# Patient Record
Sex: Female | Born: 1990 | Race: White | Hispanic: No | Marital: Married | Smoking: Never smoker
Health system: Southern US, Community
[De-identification: ages and names within clinical notes are randomized; demographics above are authoritative.]

## PROBLEM LIST (undated history)

## (undated) DIAGNOSIS — E039 Hypothyroidism, unspecified: Secondary | ICD-10-CM

## (undated) DIAGNOSIS — G43909 Migraine, unspecified, not intractable, without status migrainosus: Secondary | ICD-10-CM

## (undated) DIAGNOSIS — E079 Disorder of thyroid, unspecified: Secondary | ICD-10-CM

## (undated) DIAGNOSIS — R3129 Other microscopic hematuria: Secondary | ICD-10-CM

## (undated) DIAGNOSIS — Z8639 Personal history of other endocrine, nutritional and metabolic disease: Secondary | ICD-10-CM

## (undated) HISTORY — DX: Personal history of other endocrine, nutritional and metabolic disease: Z86.39

## (undated) HISTORY — DX: Other microscopic hematuria: R31.29

## (undated) HISTORY — DX: Disorder of thyroid, unspecified: E07.9

## (undated) HISTORY — DX: Migraine, unspecified, not intractable, without status migrainosus: G43.909

---
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
## 2009-10-18 HISTORY — PX: WISDOM TOOTH EXTRACTION: SHX21

## 2015-10-21 ENCOUNTER — Ambulatory Visit (INDEPENDENT_AMBULATORY_CARE_PROVIDER_SITE_OTHER): Payer: BLUE CROSS/BLUE SHIELD | Admitting: Family

## 2015-10-21 ENCOUNTER — Encounter: Payer: Self-pay | Admitting: Family

## 2015-10-21 ENCOUNTER — Telehealth: Payer: Self-pay | Admitting: Family

## 2015-10-21 VITALS — BP 128/79 | HR 80 | Temp 98.2°F | Resp 16 | Ht 66.5 in | Wt 129.0 lb

## 2015-10-21 DIAGNOSIS — G43909 Migraine, unspecified, not intractable, without status migrainosus: Secondary | ICD-10-CM | POA: Insufficient documentation

## 2015-10-21 DIAGNOSIS — Z23 Encounter for immunization: Secondary | ICD-10-CM

## 2015-10-21 DIAGNOSIS — E039 Hypothyroidism, unspecified: Secondary | ICD-10-CM | POA: Diagnosis not present

## 2015-10-21 DIAGNOSIS — Z309 Encounter for contraceptive management, unspecified: Secondary | ICD-10-CM | POA: Insufficient documentation

## 2015-10-21 LAB — TSH: TSH: 2.02 u[IU]/mL (ref 0.35–4.50)

## 2015-10-21 MED ORDER — ELETRIPTAN HYDROBROMIDE 40 MG PO TABS: 40.0000 mg | ORAL_TABLET | ORAL | Status: DC | PRN

## 2015-10-21 MED ORDER — LEVOTHYROXINE SODIUM 50 MCG PO TABS: 50.0000 ug | ORAL_TABLET | Freq: Every day | ORAL | Status: DC

## 2015-10-21 MED ORDER — NORGESTIM-ETH ESTRAD TRIPHASIC 0.18/0.215/0.25 MG-25 MCG PO TABS: 1.0000 | ORAL_TABLET | Freq: Every day | ORAL | Status: DC

## 2015-10-21 MED ORDER — CYCLOSPORINE 0.05 % OP EMUL: 1.0000 [drp] | Freq: Two times a day (BID) | OPHTHALMIC | Status: DC

## 2015-10-21 NOTE — Patient Instructions (Signed)
Please complete lab work prior to leaving. Schedule a complete physical at the front desk. Welcome to [facility]! 

## 2015-10-21 NOTE — Assessment & Plan Note (Signed)
Refill provided for ocp, plan pap next visit.

## 2015-10-21 NOTE — Progress Notes (Signed)
Pre visit review using our clinic review tool, if applicable. No additional management support is needed unless otherwise documented below in the visit note. 

## 2015-10-21 NOTE — Assessment & Plan Note (Signed)
Rare, refill provided for prn relpax.

## 2015-10-21 NOTE — Telephone Encounter (Signed)
Opened in error

## 2015-10-21 NOTE — Progress Notes (Signed)
Subjective:     Patient ID: Jenny Eaton, female   DOB: 24-Mar-1991, 25 y.o.   MRN: [id number]  HPI  Jenny Eaton is a 25 yr old female who presents today to establish care. Pmhx is significant for the following:  1) Migraines- uses relpax PRN. Reports that she has only had a handful of migraines in her life (5 in all). Has vision loss in 1 eye and needs to reteat to a dark room.    2) Hx Hashimoto thyroiditis/Hypothyroid- maintained on synthroid. She was diagnosed in HS 2007.  Has not seen a doctor since 2014.  She is maintained on synthroid, 50 mcg once daily.  Energy is fair.    3) Contraceptive management- LMP 12/11,  Last pap was 2014.    4) dry eye syndrome-uses restasis.   She brings with her some outside records which are reviewed.   Review of Systems  Genitourinary: Negative for menstrual problem.  Neurological:       Rare migraines, none recently.         Past Medical History  Diagnosis Date  . Migraines   . Thyroid disease     hypothyroid  . History of Hashimoto thyroiditis 2010    Social History   Social History  . Marital Status: Married    Spouse Name: N/A  . Number of Children: N/A  . Years of Education: N/A   Occupational History  . Not on file.   Social History Main Topics  . Smoking status: Never Smoker   . Smokeless tobacco: Never Used  . Alcohol Use: 0.6 - 1.2 oz/week    1-2 Glasses of wine per week  . Drug Use: No  . Sexual Activity: Not on file   Other Topics Concern  . 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 Wisdom tooth extraction  2011  . Excision of vaginal septum  2008    Family History  Problem Relation Age of Onset  . Hyperlipidemia Mother    . Hashimoto's thyroiditis Maternal Grandmother   . Kidney cancer Maternal Grandmother   . Pulmonary embolism Maternal Grandmother   . Diabetes Maternal Grandfather   . Heart disease Maternal Grandfather     CAD- living, ?hx of cabg  . Hypertension Maternal Grandfather   . Hypertension Paternal Grandmother   . Stroke Paternal Grandmother     Allergies  Allergen Reactions  . Cephalosporins Hives  . Penicillins Hives    No current outpatient prescriptions on file prior to visit.   No current facility-administered medications on file prior to visit.    BP 128/79 mmHg  Pulse 80  Temp(Src) 98.2 F (36.8 C) (Oral)  Resp 16  Ht 5' 6.5" (1.689 m)  Wt 129 lb (58.514 kg)  BMI 20.51 kg/m2  SpO2 100%  LMP 09/28/2015 (Approximate)    Objective:   Physical Exam  Constitutional: She is oriented to person, place, and time. She appears well-developed and well-nourished.  HENT:  Head: Normocephalic and atraumatic.  Eyes: No scleral icterus.  Neck: Neck supple. No thyromegaly present.  Cardiovascular: Normal rate, regular rhythm and normal heart sounds.   No murmur heard. Pulmonary/Chest: Effort normal and  breath sounds normal. No respiratory distress. She has no wheezes.  Musculoskeletal: She exhibits no edema.  Lymphadenopathy:    She has no cervical adenopathy.  Neurological: She is alert and oriented to person, place, and time.  Skin: Skin is warm and dry.  Psychiatric: She has a normal mood and affect. Her behavior is normal. Judgment and thought content normal.       Assessment:       Plan:

## 2015-10-21 NOTE — Assessment & Plan Note (Signed)
Clinically stable on synthroid, continue same, obtain TSH.  

## 2015-10-22 ENCOUNTER — Encounter: Payer: Self-pay | Admitting: Family

## 2015-10-27 ENCOUNTER — Telehealth: Payer: Self-pay | Admitting: *Deleted

## 2015-10-27 NOTE — Telephone Encounter (Signed)
Received PA request via covermymeds for relpax (non-formulary).  Will cover sumatriptan, naratriptan, rizatriptan and zolmitriptan. Left message for pt to return my call and let us know if she has tried either of these in the past.

## 2015-10-28 MED ORDER — SUMATRIPTAN SUCCINATE 50 MG PO TABS: ORAL_TABLET | ORAL | Status: DC

## 2015-10-28 NOTE — Telephone Encounter (Signed)
Melissa-- please advise if one of the below alternatives would be an appropriate replacement for relpax?

## 2015-10-28 NOTE — Telephone Encounter (Signed)
Notified pt and she voices understanding. 

## 2015-10-28 NOTE — Telephone Encounter (Signed)
Pt has not tried any other meds. She has only been on relpax for about 4 years. She used it about 4x over the course of the years for migraine. Ph# [phone] if you need to call her back.

## 2015-10-28 NOTE — Telephone Encounter (Signed)
rx sent for imitrex

## 2015-10-29 ENCOUNTER — Telehealth: Payer: Self-pay | Admitting: *Deleted

## 2015-10-29 NOTE — Telephone Encounter (Signed)
Received Denial for Restasis from [facility]; they will pay if prescribed by Ophthalmologist and/or Optometrist/SLS

## 2015-10-29 NOTE — Telephone Encounter (Signed)
Notified pt and she states she will contact ophthalmologist.

## 2015-10-29 NOTE — Telephone Encounter (Signed)
Received fax from pharmacy requesting PA for Restasis, initiated via Cover My Meds; awaiting decision/SLS

## 2015-12-08 ENCOUNTER — Encounter: Payer: BLUE CROSS/BLUE SHIELD | Admitting: Family

## 2016-01-20 ENCOUNTER — Telehealth: Payer: Self-pay | Admitting: *Deleted

## 2016-01-20 NOTE — Telephone Encounter (Signed)
Unable to reach patient at time of pre-visit call. Left message for patient to return call when available.  

## 2016-01-21 ENCOUNTER — Encounter: Payer: Self-pay | Admitting: Family

## 2016-01-21 ENCOUNTER — Ambulatory Visit (INDEPENDENT_AMBULATORY_CARE_PROVIDER_SITE_OTHER): Payer: BLUE CROSS/BLUE SHIELD | Admitting: Family

## 2016-01-21 ENCOUNTER — Other Ambulatory Visit (HOSPITAL_COMMUNITY)
Admission: RE | Admit: 2016-01-21 | Discharge: 2016-01-21 | Disposition: A | Discharge status: Home / Self Care | Payer: BLUE CROSS/BLUE SHIELD | Source: Ambulatory Visit | Attending: Family | Admitting: Family

## 2016-01-21 VITALS — BP 110/80 | HR 93 | Temp 98.2°F | Resp 16 | Ht 67.0 in | Wt 131.4 lb

## 2016-01-21 DIAGNOSIS — K589 Irritable bowel syndrome without diarrhea: Secondary | ICD-10-CM | POA: Diagnosis not present

## 2016-01-21 DIAGNOSIS — Z23 Encounter for immunization: Secondary | ICD-10-CM

## 2016-01-21 DIAGNOSIS — Z0000 Encounter for general adult medical examination without abnormal findings: Secondary | ICD-10-CM | POA: Diagnosis not present

## 2016-01-21 DIAGNOSIS — Z01411 Encounter for gynecological examination (general) (routine) with abnormal findings: Secondary | ICD-10-CM | POA: Diagnosis present

## 2016-01-21 DIAGNOSIS — Z1151 Encounter for screening for human papillomavirus (HPV): Secondary | ICD-10-CM | POA: Insufficient documentation

## 2016-01-21 DIAGNOSIS — L309 Dermatitis, unspecified: Secondary | ICD-10-CM | POA: Diagnosis not present

## 2016-01-21 DIAGNOSIS — N898 Other specified noninflammatory disorders of vagina: Secondary | ICD-10-CM | POA: Diagnosis not present

## 2016-01-21 DIAGNOSIS — Z01419 Encounter for gynecological examination (general) (routine) without abnormal findings: Secondary | ICD-10-CM

## 2016-01-21 LAB — URINALYSIS, ROUTINE W REFLEX MICROSCOPIC
Bilirubin Urine: NEGATIVE
KETONES UR: NEGATIVE
Leukocytes, UA: NEGATIVE
Nitrite: NEGATIVE
PH: 6.5 (ref 5.0–8.0)
SPECIFIC GRAVITY, URINE: 1.015 (ref 1.000–1.030)
Total Protein, Urine: NEGATIVE
Urine Glucose: NEGATIVE
Urobilinogen, UA: 0.2 (ref 0.0–1.0)

## 2016-01-21 LAB — HEPATIC FUNCTION PANEL
ALK PHOS: 31 U/L — AB (ref 39–117)
ALT: 10 U/L (ref 0–35)
AST: 14 U/L (ref 0–37)
Albumin: 4.3 g/dL (ref 3.5–5.2)
BILIRUBIN DIRECT: 0.1 mg/dL (ref 0.0–0.3)
BILIRUBIN TOTAL: 0.4 mg/dL (ref 0.2–1.2)
Total Protein: 7.1 g/dL (ref 6.0–8.3)

## 2016-01-21 LAB — CBC WITH DIFFERENTIAL/PLATELET
HCT: 39.5 % (ref 36.0–46.0)
HEMOGLOBIN: 13.3 g/dL (ref 12.0–15.0)
MCHC: 33.6 g/dL (ref 30.0–36.0)
MCV: 92.4 fl (ref 78.0–100.0)
PLATELETS: 229 10*3/uL (ref 150.0–400.0)
RBC: 4.27 Mil/uL (ref 3.87–5.11)
RDW: 12.9 % (ref 11.5–15.5)
WBC: 4.8 10*3/uL (ref 4.0–10.5)

## 2016-01-21 LAB — LIPID PANEL
CHOL/HDL RATIO: 2
Cholesterol: 158 mg/dL (ref 0–200)
HDL: 70.5 mg/dL (ref >39.00)
LDL CALC: 77 mg/dL (ref 0–99)
NONHDL: 87.02
TRIGLYCERIDES: 52 mg/dL (ref 0.0–149.0)
VLDL: 10.4 mg/dL (ref 0.0–40.0)

## 2016-01-21 LAB — TSH: TSH: 2.72 u[IU]/mL (ref 0.35–4.50)

## 2016-01-21 LAB — BASIC METABOLIC PANEL
BUN: 14 mg/dL (ref 6–23)
CALCIUM: 9.5 mg/dL (ref 8.4–10.5)
CO2: 30 mEq/L (ref 19–32)
CREATININE: 0.63 mg/dL (ref 0.40–1.20)
Chloride: 104 mEq/L (ref 96–112)
GFR: 121.36 mL/min (ref >60.00)
GLUCOSE: 87 mg/dL (ref 70–99)
Potassium: 4 mEq/L (ref 3.5–5.1)
SODIUM: 138 meq/L (ref 135–145)

## 2016-01-21 MED ORDER — LUBIPROSTONE 8 MCG PO CAPS: 8.0000 ug | ORAL_CAPSULE | Freq: Two times a day (BID) | ORAL | Status: DC

## 2016-01-21 MED ORDER — NORGESTIM-ETH ESTRAD TRIPHASIC 0.18/0.215/0.25 MG-25 MCG PO TABS: 1.0000 | ORAL_TABLET | Freq: Every day | ORAL | Status: DC

## 2016-01-21 NOTE — Progress Notes (Signed)
Pre visit review using our clinic review tool, if applicable. No additional management support is needed unless otherwise documented below in the visit note. 

## 2016-01-21 NOTE — Assessment & Plan Note (Addendum)
Continue healthy diet, exercise. Tdap today.  Obtain routine lab work.  Pap today, will check ancillary testing to see if she has candidiasis.

## 2016-01-21 NOTE — Progress Notes (Signed)
Subjective:    Patient ID: Jenny Eaton, female    DOB: Jun 19, 1991, 25 y.o.   MRN: [id number]  HPI  Jenny Eaton is a 25 yr old female who presents today for cpx.  Patient presents today for complete physical.  Immunizations: last tetanus 2006 Diet: healthy Exercise: regular Pap Smear: 3 years ago Dental: up to date Vision: 1 year ago  Constipation- reports that she never has diarrhea.  Reports that she has had bloating/pain, using prune juice stool softner, eliminating dairy but this has not helped.  Mom has IBS.  Dry skin- scaly dry patches on her chest, which "go away in the sun."   Review of Systems  Constitutional: Negative for fever.       5 pound weight gain  HENT: Positive for rhinorrhea. Negative for hearing loss and tinnitus.   Eyes: Negative for visual disturbance.  Respiratory: Negative for cough and shortness of breath.   Cardiovascular: Negative for chest pain, palpitations and leg swelling.  Gastrointestinal: Positive for constipation.  Genitourinary: Negative for dysuria, urgency, frequency and vaginal discharge.  Skin:       See HPI  Neurological: Negative for syncope and headaches.       Occasional dizziness with allergy flare  Psychiatric/Behavioral:       Denies depression, feels stressed though   Past Medical History  Diagnosis Date  . Migraines   . Thyroid disease     hypothyroid  . History of Hashimoto thyroiditis 2010    Social History   Social History  . Marital Status: Married    Spouse Name: N/A  . Number of Children: N/A  . Years of Education: N/A   Occupational History  . Not on file.   Social History Main Topics  . Smoking status: Never Smoker   . Smokeless tobacco: Never Used  . Alcohol Use: 0.6 - 1.2 oz/week    1-2 Glasses of wine per week  . Drug Use: No  . Sexual Activity: Not on file   Other Topics Concern  . 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 Wisdom tooth extraction  2011  . Excision of vaginal septum  2008    Family History  Problem Relation Age of Onset  . Hyperlipidemia Mother   . Hashimoto's thyroiditis Maternal Grandmother   . Kidney cancer Maternal Grandmother   . Pulmonary embolism Maternal Grandmother   . Diabetes Maternal Grandfather   . Heart disease Maternal Grandfather     CAD- living, ?hx of cabg  . Hypertension Maternal Grandfather   . Hypertension Paternal Grandmother   . Stroke Paternal Grandmother     Allergies  Allergen Reactions  . Cephalosporins Hives  . Penicillins Hives    Current Outpatient Prescriptions on File Prior to Visit  Medication Sig Dispense Refill  . cycloSPORINE (RESTASIS) 0.05 % ophthalmic emulsion Place 1 drop into both eyes 2 (two) times daily. 0.4 mL 11  . levothyroxine (SYNTHROID, LEVOTHROID) 50 MCG tablet Take 1 tablet (50 mcg total) by mouth daily. 30 tablet 5  . SUMAtriptan (IMITREX) 50 MG tablet May repeat in 2 hours if headache persists (max 2 tabs/24 hrs) 10 tablet 2  No current facility-administered medications on file prior to visit.    BP 110/80 mmHg  Pulse 93  Temp(Src) 98.2 F (36.8 C) (Oral)  Resp 16  Ht 5\' 7"  (1.702 m)  Wt 131 lb 6.4 oz (59.603 kg)  BMI 20.58 kg/m2  SpO2 99%  LMP 01/19/2016       Objective:   Physical Exam  Physical Exam  Constitutional: She is oriented to person, place, and time. She appears well-developed and well-nourished. No distress.  HENT:  Head: Normocephalic and atraumatic.  Right Ear: Tympanic membrane and ear canal normal.  Left Ear: Tympanic membrane and ear canal normal.  Mouth/Throat: Oropharynx is clear and moist.  Eyes: Pupils are equal, round, and reactive to light. No scleral  icterus.  Neck: Normal range of motion. No thyromegaly present.  Cardiovascular: Normal rate and regular rhythm.   No murmur heard. Pulmonary/Chest: Effort normal and breath sounds normal. No respiratory distress. He has no wheezes. She has no rales. She exhibits no tenderness.  Abdominal: Soft. Bowel sounds are normal. He exhibits no distension and no mass. There is no tenderness. There is no rebound and no guarding.  Musculoskeletal: She exhibits no edema.  Lymphadenopathy:    She has no cervical adenopathy.  Neurological: She is alert and oriented to person, place, and time. She has normal patellar reflexes. She exhibits normal muscle tone. Coordination normal.  Skin: Skin is warm and dry. small pink/rough patch noted left breast Psychiatric: She has a normal mood and affect. Her behavior is normal. Judgment and thought content normal.  Breasts: Examined lying Right: Without masses, retractions, discharge or axillary adenopathy.  Left: Without masses, retractions, discharge or axillary adenopathy.  Inguinal/mons: Normal without inguinal adenopathy  External genitalia: Normal  BUS/Urethra/Skene's glands: Normal  Bladder: Normal  Vagina: some thick white blood tinged vaginal discharge is noted Cervix: Normal  Uterus: normal in size, shape and contour. Midline and mobile  Adnexa/parametria:  Rt: Without masses or tenderness.  Lt: Without masses or tenderness.  Anus and perineum: Normal           Assessment & Plan:  Dermatitis (breast)- recommended trial of otc hydrocortisone prn.        Assessment & Plan:

## 2016-01-21 NOTE — Addendum Note (Signed)
Addended by: Kelle Darting A on: 01/21/2016 05:09 PM   Modules accepted: Orders

## 2016-01-21 NOTE — Patient Instructions (Addendum)
Please complete lab work prior to leaving. Start Amitiza for constipation. Continue high fiber diet, exercise and plenty of water.

## 2016-01-21 NOTE — Assessment & Plan Note (Signed)
Patient with IBS-C. Will give trial of amitiza.

## 2016-01-21 NOTE — Addendum Note (Signed)
Addended by: Kelle Darting A on: 01/21/2016 09:00 AM   Modules accepted: Orders

## 2016-01-22 LAB — CYTOLOGY - PAP

## 2016-01-23 LAB — CERVICOVAGINAL ANCILLARY ONLY: Candida vaginitis: NEGATIVE

## 2016-01-24 ENCOUNTER — Telehealth: Payer: Self-pay | Admitting: Family

## 2016-01-24 DIAGNOSIS — R87612 Low grade squamous intraepithelial lesion on cytologic smear of cervix (LGSIL): Secondary | ICD-10-CM

## 2016-01-24 DIAGNOSIS — R87619 Unspecified abnormal cytological findings in specimens from cervix uteri: Secondary | ICD-10-CM | POA: Insufficient documentation

## 2016-01-24 NOTE — Telephone Encounter (Signed)
Please notify pt that pap is abnormal. I would like her to meet with GYN for further evaluation. There was no yeast noted.  Other labs look good.,

## 2016-02-03 ENCOUNTER — Encounter: Payer: Self-pay | Admitting: Family

## 2016-02-25 ENCOUNTER — Encounter: Payer: BLUE CROSS/BLUE SHIELD | Admitting: Family Medicine

## 2016-03-03 ENCOUNTER — Encounter: Payer: Self-pay | Admitting: Obstetrics & Gynecology

## 2016-03-03 ENCOUNTER — Other Ambulatory Visit (HOSPITAL_COMMUNITY)
Admission: RE | Admit: 2016-03-03 | Discharge: 2016-03-03 | Disposition: A | Discharge status: Home / Self Care | Payer: BLUE CROSS/BLUE SHIELD | Source: Ambulatory Visit | Attending: Obstetrics & Gynecology | Admitting: Obstetrics & Gynecology

## 2016-03-03 ENCOUNTER — Ambulatory Visit (INDEPENDENT_AMBULATORY_CARE_PROVIDER_SITE_OTHER): Payer: BLUE CROSS/BLUE SHIELD | Admitting: Obstetrics & Gynecology

## 2016-03-03 ENCOUNTER — Ambulatory Visit: Payer: BLUE CROSS/BLUE SHIELD | Admitting: Family

## 2016-03-03 VITALS — BP 121/77 | HR 88 | Ht 65.0 in | Wt 132.0 lb

## 2016-03-03 DIAGNOSIS — N870 Mild cervical dysplasia: Secondary | ICD-10-CM | POA: Insufficient documentation

## 2016-03-03 DIAGNOSIS — R87612 Low grade squamous intraepithelial lesion on cytologic smear of cervix (LGSIL): Secondary | ICD-10-CM | POA: Diagnosis not present

## 2016-03-03 NOTE — Patient Instructions (Signed)
Colposcopy  Colposcopy is a procedure to examine your cervix and vagina, or the area around the outside of your vagina, for abnormalities or signs of disease. The procedure is done using a lighted microscope called a colposcope. Tissue samples may be collected during the colposcopy if your health care provider finds any unusual cells. A colposcopy may be done if a woman has:  · An abnormal Pap test. A Pap test is a medical test done to evaluate cells that are on the surface of the cervix.  · A Pap test result that is suggestive of human papillomavirus (HPV). This virus can cause genital warts and is linked to the development of cervical cancer.  · A sore on her cervix and the results of a Pap test were normal.  · Genital warts on the cervix or in or around the outside of the vagina.  · A mother who took the drug diethylstilbestrol (DES) while pregnant.  · Painful intercourse.  · Vaginal bleeding, especially after sexual intercourse.  LET YOUR HEALTH CARE PROVIDER KNOW ABOUT:  · Any allergies you have.  · All medicines you are taking, including vitamins, herbs, eye drops, creams, and over-the-counter medicines.  · Previous problems you or members of your family have had with the use of anesthetics.  · Any blood disorders you have.  · Previous surgeries you have had.  · Medical conditions you have.  RISKS AND COMPLICATIONS  Generally, a colposcopy is a safe procedure. However, as with any procedure, complications can occur. Possible complications include:  · Bleeding.  · Infection.  · Missed lesions.  BEFORE THE PROCEDURE   · Tell your health care provider if you have your menstrual period. A colposcopy typically is not done during menstruation.  · For 24 hours before the colposcopy, do not:    Douche.    Use tampons.    Use medicines, creams, or suppositories in the vagina.    Have sexual intercourse.  PROCEDURE   During the procedure, you will be lying on your back with your feet in foot rests (stirrups). A warm  metal or plastic instrument (speculum) will be placed in your vagina to keep it open and to allow the health care provider to see the cervix. The colposcope will be placed outside the vagina. It will be used to magnify and examine the cervix, vagina, and the area around the outside of the vagina. A small amount of liquid solution will be placed on the area that is to be viewed. This solution will make it easier to see the abnormal cells. Your health care provider will use tools to suck out mucus and cells from the canal of the cervix. Then he or she will record the location of the abnormal areas.  If a biopsy is done during the procedure, a medicine will usually be given to numb the area (local anesthetic). You may feel mild pain or cramping while the biopsy is done. After the procedure, tissue samples collected during the biopsy will be sent to a lab for analysis.  AFTER THE PROCEDURE   You will be given instructions on when to follow up with your health care provider for your test results. It is important to keep your appointment.     This information is not intended to replace advice given to you by your health care provider. Make sure you discuss any questions you have with your health care provider.     Document Released: 12/25/2002 Document Revised: 06/06/2013 Document Reviewed: 05/03/2013    Elsevier Interactive Patient Education ©2016 Elsevier Inc.

## 2016-03-03 NOTE — Progress Notes (Signed)
Patient ID: Jenny Eaton, female   DOB: [birth year], 25 y.o.   MRN: [id number] Patient given informed consent, signed copy in the chart, time out was performed.  Placed in lithotomy position. Cervix viewed with speculum and colposcope after application of acetic acid.  01/21/2016 Adequacy Reason Satisfactory for evaluation, endocervical/transformation zone component PRESENT. Diagnosis LOW GRADE SQUAMOUS INTRAEPITHELIAL LESION: CIN-1/ HPV (LSIL). THERE ARE A FEW CELLS SUGGESTIVE OF A HIGHER GRADE LESION. CLINICAL CORRELATION IS RECOMMENDED. Colposcopy adequate?  yes Acetowhite lesions?yes Punctation?no Mosaicism?  Yes @1  o'clock Abnormal vasculature?  no Biopsies?yes ECC?yes  Patient was given post procedure instructions.  She will return in 2 weeks for results.  [name] L. Harraway-Smith, M.D., Cherlynn June

## 2016-03-08 ENCOUNTER — Telehealth: Payer: Self-pay | Admitting: Family

## 2016-03-08 ENCOUNTER — Ambulatory Visit (INDEPENDENT_AMBULATORY_CARE_PROVIDER_SITE_OTHER): Payer: BLUE CROSS/BLUE SHIELD | Admitting: Family

## 2016-03-08 ENCOUNTER — Telehealth: Payer: Self-pay

## 2016-03-08 ENCOUNTER — Encounter: Payer: Self-pay | Admitting: Family

## 2016-03-08 VITALS — BP 130/90 | HR 70 | Temp 98.3°F | Resp 16 | Ht 67.0 in | Wt 133.6 lb

## 2016-03-08 DIAGNOSIS — R87619 Unspecified abnormal cytological findings in specimens from cervix uteri: Secondary | ICD-10-CM

## 2016-03-08 DIAGNOSIS — K589 Irritable bowel syndrome without diarrhea: Secondary | ICD-10-CM | POA: Diagnosis not present

## 2016-03-08 NOTE — Telephone Encounter (Signed)
No charge. 

## 2016-03-08 NOTE — Progress Notes (Signed)
Pre visit review using our clinic review tool, if applicable. No additional management support is needed unless otherwise documented below in the visit note. 

## 2016-03-08 NOTE — Progress Notes (Signed)
Subjective:    Patient ID: Jenny Eaton, female    DOB: 30-Aug-1991, 25 y.o.   MRN: [id number]  HPI  Ms. Lauersdorf is a 25 yr old female who presents today for follow up of her IBS.   Last visit she was given rx for Amitiza for her chronic constipation.  She reports that she experienced constipation on the amitiza so she discontinued. Since she discontinued the amitiza, she has not experienced any further constipation.   Abnormal Pap- pt had recent colposcopy which showed low grade dysplasia.   Review of Systems    see HPI  Past Medical History  Diagnosis Date  . Migraines   . Thyroid disease     hypothyroid  . History of Hashimoto thyroiditis 2010     Social History   Social History  . Marital Status: Married    Spouse Name: N/A  . Number of Children: N/A  . Years of Education: N/A   Occupational History  . Not on file.   Social History Main Topics  . Smoking status: Never Smoker   . Smokeless tobacco: Never Used  . Alcohol Use: 0.6 - 1.2 oz/week    1-2 Glasses of wine per week  . Drug Use: No  . Sexual Activity: Yes    Birth Control/ Protection: Pill   Other Topics Concern  . 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 Wisdom tooth extraction  2011  . Excision of vaginal septum  2008    Family History  Problem Relation Age of Onset  . Hyperlipidemia Mother   . Hashimoto's thyroiditis Maternal Grandmother   . Kidney cancer Maternal Grandmother   . Pulmonary embolism Maternal Grandmother   . Cancer Maternal Grandmother   . Diabetes Maternal Grandfather   . Heart disease Maternal Grandfather     CAD- living, ?hx of cabg  . Hypertension Maternal Grandfather   . Hypertension  Paternal Grandmother   . Stroke Paternal Grandmother   . COPD Neg Hx     Allergies  Allergen Reactions  . Cephalosporins Hives  . Penicillins Hives    Current Outpatient Prescriptions on File Prior to Visit  Medication Sig Dispense Refill  . cycloSPORINE (RESTASIS) 0.05 % ophthalmic emulsion Place 1 drop into both eyes 2 (two) times daily. 0.4 mL 11  . levothyroxine (SYNTHROID, LEVOTHROID) 50 MCG tablet Take 1 tablet (50 mcg total) by mouth daily. 30 tablet 5  . Norgestimate-Ethinyl Estradiol Triphasic (ORTHO TRI-CYCLEN LO) 0.18/0.215/0.25 MG-25 MCG tab Take 1 tablet by mouth daily. 3 Package 1  . SUMAtriptan (IMITREX) 50 MG tablet May repeat in 2 hours if headache persists (max 2 tabs/24 hrs) 10 tablet 2   No current facility-administered medications on file prior to visit.    BP 130/90 mmHg  Pulse 70  Temp(Src) 98.3 F (36.8 C) (Oral)  Resp 16  Ht 5\' 7"  (1.702 m)  Wt 133 lb 9.6 oz (60.601 kg)  BMI 20.92 kg/m2  SpO2 100%  LMP 02/12/2016    Objective:   Physical Exam  Constitutional: She is oriented to person, place, and time. She appears well-developed and well-nourished.  Neurological: She is alert and oriented to  person, place, and time.  Psychiatric: She has a normal mood and affect. Her behavior is normal. Judgment and thought content normal.          Assessment & Plan:

## 2016-03-08 NOTE — Telephone Encounter (Signed)
Pt lvm at 11:44 to cancel appt at 5:45 today. Pt didn't say why.    Should pt be charged?

## 2016-03-08 NOTE — Patient Instructions (Signed)
Let me know if you have any further issues with your bowels.

## 2016-03-08 NOTE — Assessment & Plan Note (Addendum)
Did not tolerate amitiza due to diarrhea. No current issues with bowels.  Will monitor off of medications.

## 2016-03-08 NOTE — Telephone Encounter (Signed)
Patient called and given biopsy results of low grade dysplasia. Patient made aware that per Dr. Maylene RoesTamala Julian the plan is to make sure she gets her pap smear next year. Patient states understanding. Kathrene Alu RN BSN

## 2016-03-08 NOTE — Assessment & Plan Note (Addendum)
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 Pt is being followed by GYN- Dr. Ihor Dow. Plan is for her to repeat pap in 1 year with GYN.

## 2016-03-18 ENCOUNTER — Ambulatory Visit: Payer: BLUE CROSS/BLUE SHIELD | Admitting: Obstetrics & Gynecology

## 2016-04-05 ENCOUNTER — Other Ambulatory Visit: Payer: Self-pay

## 2016-04-05 MED ORDER — LEVOTHYROXINE SODIUM 50 MCG PO TABS: 50.0000 ug | ORAL_TABLET | Freq: Every day | ORAL | Status: DC

## 2016-04-05 MED ORDER — NORGESTIM-ETH ESTRAD TRIPHASIC 0.18/0.215/0.25 MG-25 MCG PO TABS: 1.0000 | ORAL_TABLET | Freq: Every day | ORAL | Status: DC

## 2016-04-05 NOTE — Telephone Encounter (Signed)
Pt requesting 90 day supply be sent to [facility] for her levothyroxine. Pt requests her birth control be sent to [facility] as well. She needs refill in 2 weeks and will have to pay more if she gets at [facility].

## 2016-04-05 NOTE — Telephone Encounter (Signed)
Left message for pt to clarify the pharmacy that she wanted the Rx sent to for a 90 day supply.

## 2016-04-13 ENCOUNTER — Telehealth: Payer: Self-pay | Admitting: *Deleted

## 2016-04-13 MED ORDER — SUMATRIPTAN SUCCINATE 50 MG PO TABS: ORAL_TABLET | ORAL | Status: DC

## 2016-04-13 NOTE — Telephone Encounter (Signed)
Request from [facility] for 90 day supply sumatriptan. Rx sent.

## 2016-04-14 ENCOUNTER — Encounter: Payer: Self-pay | Admitting: Family

## 2016-04-14 MED ORDER — CLOBETASOL PROPIONATE 0.05 % EX OINT: 1.0000 "application " | TOPICAL_OINTMENT | Freq: Two times a day (BID) | CUTANEOUS | Status: DC

## 2016-05-17 ENCOUNTER — Encounter: Payer: Self-pay | Admitting: Family

## 2016-05-18 MED ORDER — FLUTICASONE PROPIONATE 50 MCG/ACT NA SUSP: 1.0000 | Freq: Every day | NASAL | 1 refills | Status: DC

## 2016-06-28 ENCOUNTER — Ambulatory Visit (INDEPENDENT_AMBULATORY_CARE_PROVIDER_SITE_OTHER): Payer: BLUE CROSS/BLUE SHIELD | Admitting: Family

## 2016-06-28 ENCOUNTER — Encounter: Payer: Self-pay | Admitting: Family

## 2016-06-28 VITALS — BP 138/83 | HR 78 | Temp 98.4°F | Resp 16 | Ht 67.0 in | Wt 132.8 lb

## 2016-06-28 DIAGNOSIS — K589 Irritable bowel syndrome without diarrhea: Secondary | ICD-10-CM

## 2016-06-28 DIAGNOSIS — H04123 Dry eye syndrome of bilateral lacrimal glands: Secondary | ICD-10-CM

## 2016-06-28 DIAGNOSIS — Z23 Encounter for immunization: Secondary | ICD-10-CM | POA: Diagnosis not present

## 2016-06-28 DIAGNOSIS — G43809 Other migraine, not intractable, without status migrainosus: Secondary | ICD-10-CM

## 2016-06-28 DIAGNOSIS — E039 Hypothyroidism, unspecified: Secondary | ICD-10-CM | POA: Diagnosis not present

## 2016-06-28 NOTE — Assessment & Plan Note (Signed)
Stable without meds. 

## 2016-06-28 NOTE — Assessment & Plan Note (Signed)
Stable, no recent migraines. Monitor.  

## 2016-06-28 NOTE — Patient Instructions (Signed)
Please complete lab work prior to leaving.   

## 2016-06-28 NOTE — Assessment & Plan Note (Signed)
Clinically stable on synthroid, continue same.  

## 2016-06-28 NOTE — Progress Notes (Signed)
Subjective:    Patient ID: Jenny Eaton, female    DOB: [birth year], 25 y.o.   MRN: [id number]  HPI   Jenny Eaton is a 25 yr old female who presents today for follow up.  Hypothyroid- reports feeling well on current dose of synthroid.   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  Might have been stress related.    Migraines- no recent migraines.  Dry eye- reports that she has "plugs in both of her tear ducts." went to eye doctor and had plugs removed.  No longer having irritation, excessive tearing. No longer having dry eye. Not using restasis.     Review of Systems See HPI  Past Medical History:  Diagnosis Date  . History of Hashimoto thyroiditis 2010  . Migraines   . Thyroid disease    hypothyroid     Social History   Social History  . Marital status: Married    Spouse name: N/A  . Number of children: N/A  . Years of education: N/A   Occupational History  . Not on file.   Social History Main Topics  . Smoking status: Never Smoker  . Smokeless tobacco: Never Used  . Alcohol use 0.6 - 1.2 oz/week    1 - 2 Glasses of wine per week  . Drug use: No  . Sexual activity: Yes    Birth control/ protection: Pill   Other Topics Concern  . 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 excision of vaginal septum  2008  . WISDOM TOOTH EXTRACTION  2011    Family History  Problem Relation Age of Onset  . Hyperlipidemia Mother   . Hashimoto's thyroiditis Maternal Grandmother   . Kidney cancer Maternal Grandmother   . Pulmonary embolism Maternal Grandmother   . Cancer Maternal Grandmother   . Diabetes Maternal Grandfather   .  Heart disease Maternal Grandfather     CAD- living, ?hx of cabg  . Hypertension Maternal Grandfather   . Hypertension Paternal Grandmother   . Stroke Paternal Grandmother   . COPD Neg Hx     Allergies  Allergen Reactions  . Cephalosporins Hives  . Penicillins Hives    Current Outpatient Prescriptions on File Prior to Visit  Medication Sig Dispense Refill  . clobetasol ointment (TEMOVATE) [id number] % Apply 1 application topically 2 (two) times daily. 30 g 1  . cycloSPORINE (RESTASIS) 0.05 % ophthalmic emulsion Place 1 drop into both eyes 2 (two) times daily. 0.4 mL 11  . fluticasone (FLONASE) 50 MCG/ACT nasal spray Place 1 spray into both nostrils daily. 48 g 1  . levothyroxine (SYNTHROID, LEVOTHROID) 50 MCG tablet Take 1 tablet (50 mcg total) by mouth daily. 90 tablet 0  . Norgestimate-Ethinyl Estradiol Triphasic (ORTHO TRI-CYCLEN LO) 0.18/0.215/0.25 MG-25 MCG tab Take 1 tablet by mouth daily. 3 Package 1  . SUMAtriptan (IMITREX) 50 MG tablet May repeat in 2 hours if headache persists (max 2 tabs/24 hrs) 30 tablet 1   No current facility-administered medications on file prior to visit.     BP 138/83 (BP Location: Right Arm, Cuff Size:  Normal)   Pulse 78   Temp 98.4 F (36.9 C) (Oral)   Resp 16   Ht 5\' 7"  (1.702 m)   Wt 132 lb 12.8 oz (60.2 kg)   LMP 05/31/2016   SpO2 100% Comment: room air  BMI 20.80 kg/m       Objective:   Physical Exam  Constitutional: She is oriented to person, place, and time. She appears well-developed and well-nourished.  HENT:  Head: Normocephalic and atraumatic.  Cardiovascular: Normal rate, regular rhythm and normal heart sounds.   No murmur heard. Pulmonary/Chest: Effort normal and breath sounds normal. No respiratory distress. She has no wheezes.  Neurological: She is alert and oriented to person, place, and time.  Psychiatric: She has a normal mood and affect. Her behavior is normal. Judgment and thought content normal.            Assessment & Plan:  Flu shot today.   Dry eye- improved, no longer needing restasis.

## 2016-06-28 NOTE — Progress Notes (Signed)
Pre visit review using our clinic review tool, if applicable. No additional management support is needed unless otherwise documented below in the visit note. 

## 2016-06-29 LAB — TSH: TSH: 1.36 u[IU]/mL (ref 0.35–4.50)

## 2016-07-05 ENCOUNTER — Other Ambulatory Visit: Payer: Self-pay | Admitting: Family

## 2016-08-23 ENCOUNTER — Ambulatory Visit: Payer: Self-pay | Admitting: Family

## 2016-10-05 ENCOUNTER — Other Ambulatory Visit: Payer: Self-pay | Admitting: Family

## 2016-12-27 ENCOUNTER — Ambulatory Visit: Payer: BLUE CROSS/BLUE SHIELD | Admitting: Family

## 2017-01-03 ENCOUNTER — Ambulatory Visit (INDEPENDENT_AMBULATORY_CARE_PROVIDER_SITE_OTHER): Payer: BLUE CROSS/BLUE SHIELD | Admitting: Family

## 2017-01-03 ENCOUNTER — Encounter: Payer: Self-pay | Admitting: Family

## 2017-01-03 ENCOUNTER — Ambulatory Visit: Payer: BLUE CROSS/BLUE SHIELD | Admitting: Family

## 2017-01-03 VITALS — BP 144/91 | HR 79 | Temp 98.3°F | Resp 16 | Ht 67.0 in | Wt 134.6 lb

## 2017-01-03 DIAGNOSIS — N6459 Other signs and symptoms in breast: Secondary | ICD-10-CM

## 2017-01-03 DIAGNOSIS — E039 Hypothyroidism, unspecified: Secondary | ICD-10-CM | POA: Diagnosis not present

## 2017-01-03 DIAGNOSIS — R030 Elevated blood-pressure reading, without diagnosis of hypertension: Secondary | ICD-10-CM | POA: Diagnosis not present

## 2017-01-03 MED ORDER — TERBINAFINE HCL 250 MG PO TABS: 250.0000 mg | ORAL_TABLET | Freq: Every day | ORAL | 0 refills | Status: DC

## 2017-01-03 NOTE — Patient Instructions (Addendum)
You will be contacted about your breast imaging.   Begin lamisil for your skin infection.

## 2017-01-03 NOTE — Progress Notes (Signed)
Pre visit review using our clinic review tool, if applicable. No additional management support is needed unless otherwise documented below in the visit note. 

## 2017-01-03 NOTE — Progress Notes (Addendum)
Subjective:    Patient ID: Jenny Eaton, female    DOB: 1991/06/28, 26 y.o.   MRN: [id number]  HPI  Jenny Eaton is a 26 yr old female here today for follow up.  1) Hypothyroid- reports good energy on synthriod.  Lab Results  Component Value Date   TSH 1.36 06/28/2016   2) Elevated blood pressure-  Notes that she is very nervous today about her breast problem.   BP Readings from Last 3 Encounters:  01/03/17 (!) 144/91  06/28/16 138/83  03/08/16 130/90   3) Breast problem-  Reports that 3 weeks ago she felt abnormality in the right breast.    Review of Systems See HPI  Past Medical History:  Diagnosis Date  . History of Hashimoto thyroiditis 2010  . Migraines   . Thyroid disease    hypothyroid     Social History   Social History  . Marital status: Married    Spouse name: N/A  . Number of children: N/A  . Years of education: N/A   Occupational History  . Not on file.   Social History Main Topics  . Smoking status: Never Smoker  . Smokeless tobacco: Never Used  . Alcohol use 0.6 - 1.2 oz/week    1 - 2 Glasses of wine per week  . Drug use: No  . Sexual activity: Yes    Birth control/ protection: Pill   Other Topics Concern  . 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 excision of vaginal septum  2008  . WISDOM TOOTH EXTRACTION  2011    Family History  Problem Relation Age of Onset  . Hyperlipidemia Mother   . Hashimoto's thyroiditis Maternal Grandmother   . Kidney cancer Maternal Grandmother   . Pulmonary embolism Maternal Grandmother   . Cancer Maternal Grandmother   . Diabetes Maternal Grandfather   . Heart disease Maternal Grandfather     CAD- living, ?hx of  cabg  . Hypertension Maternal Grandfather   . Hypertension Paternal Grandmother   . Stroke Paternal Grandmother   . COPD Neg Hx     Allergies  Allergen Reactions  . Cephalosporins Hives  . Penicillins Hives    Current Outpatient Prescriptions on File Prior to Visit  Medication Sig Dispense Refill  . fluticasone (FLONASE) 50 MCG/ACT nasal spray Place 1 spray into both nostrils daily. 48 g 1  . levothyroxine (SYNTHROID, LEVOTHROID) 50 MCG tablet TAKE 1 TABLET DAILY 90 tablet 1  . SUMAtriptan (IMITREX) 50 MG tablet May repeat in 2 hours if headache persists (max 2 tabs/24 hrs) 30 tablet 1  . TRI-LO-MARZIA 0.18/0.215/0.25 MG-25 MCG tab TAKE 1 TABLET DAILY 84 tablet 0   No current facility-administered medications on file prior to visit.     BP (!) 144/91 (BP Location: Right Arm, Cuff Size: Normal)   Pulse 79   Temp 98.3 F (36.8 C) (Oral)   Resp 16   Ht 5\' 7"  (1.702 m)   Wt 134 lb 9.6 oz (61.1 kg)   SpO2 99% Comment: room air  BMI 21.08 kg/m       Objective:   Physical Exam  Constitutional: She appears well-developed  and well-nourished.  Cardiovascular: Normal rate, regular rhythm and normal heart sounds.   No murmur heard. Pulmonary/Chest: Effort normal and breath sounds normal. No respiratory distress. She has no wheezes.  Psychiatric: She has a normal mood and affect. Her behavior is normal. Judgment and thought content normal.  Breasts:  Normal breast tissue bilaterally but possibly some thickening of breast tissue right breast at 12 oclock.         Assessment & Plan:  Abnormal breast exam- She is very worried about this. Will obtain a diagnostic mammogram and [place] to further evaluate.  Elevated blood pressure- likely related to anxiety. Will plan to repeat her bp in 1 month.   Hypothyroid- clinically stable on synthroid, continue same.  Follow up TSH WNL.  Lab Results  Component Value Date   TSH 2.18 01/03/2017   Addendum: Reviewed note:   Pt was not pregnant  at time of visit.  Removed erroneous diagnosis.

## 2017-01-04 LAB — TSH: TSH: 2.18 u[IU]/mL (ref 0.35–4.50)

## 2017-01-10 ENCOUNTER — Encounter: Payer: Self-pay | Admitting: Family

## 2017-01-11 ENCOUNTER — Other Ambulatory Visit: Payer: Self-pay | Admitting: Family

## 2017-01-11 DIAGNOSIS — N6459 Other signs and symptoms in breast: Secondary | ICD-10-CM

## 2017-01-14 ENCOUNTER — Other Ambulatory Visit: Payer: BLUE CROSS/BLUE SHIELD

## 2017-01-14 ENCOUNTER — Ambulatory Visit
Admission: RE | Admit: 2017-01-14 | Discharge: 2017-01-14 | Disposition: A | Discharge status: Home / Self Care | Payer: BLUE CROSS/BLUE SHIELD | Source: Ambulatory Visit | Attending: Family | Admitting: Family

## 2017-01-14 DIAGNOSIS — N6459 Other signs and symptoms in breast: Secondary | ICD-10-CM

## 2017-01-24 ENCOUNTER — Other Ambulatory Visit: Payer: Self-pay | Admitting: Family

## 2017-01-25 NOTE — Telephone Encounter (Signed)
Refill sent per [facility] refill protocol/SLS  

## 2017-01-31 ENCOUNTER — Encounter: Payer: Self-pay | Admitting: Family

## 2017-01-31 ENCOUNTER — Ambulatory Visit (INDEPENDENT_AMBULATORY_CARE_PROVIDER_SITE_OTHER): Payer: BLUE CROSS/BLUE SHIELD | Admitting: Family

## 2017-01-31 VITALS — BP 131/83 | HR 79 | Temp 98.2°F | Resp 18 | Ht 67.0 in | Wt 133.6 lb

## 2017-01-31 DIAGNOSIS — N6019 Diffuse cystic mastopathy of unspecified breast: Secondary | ICD-10-CM | POA: Diagnosis not present

## 2017-01-31 DIAGNOSIS — R21 Rash and other nonspecific skin eruption: Secondary | ICD-10-CM

## 2017-01-31 DIAGNOSIS — R030 Elevated blood-pressure reading, without diagnosis of hypertension: Secondary | ICD-10-CM | POA: Diagnosis not present

## 2017-01-31 NOTE — Progress Notes (Signed)
Subjective:    Patient ID: Jenny Eaton, female    DOB: 1991-04-23, 26 y.o.   MRN: [id number]  HPI  Jenny Eaton is a 26 yr old female who presents today for follow up.   Last visit BP was elevated.   She was very anxious that day. Mom has hypertension.  BP Readings from Last 3 Encounters:  01/31/17 131/83  01/03/17 (!) 144/91  06/28/16 138/83   Abnormal breast exam- she had a breast US which was consistent with fibrocystic changes and it was recommended that she repeat in 3 months.   Skin rash- reports rash is resolved with lamisil.    Review of Systems See HPI  Past Medical History:  Diagnosis Date  . History of Hashimoto thyroiditis 2010  . Migraines   . Thyroid disease    hypothyroid     Social History   Social History  . Marital status: Married    Spouse name: N/A  . Number of children: N/A  . Years of education: N/A   Occupational History  . Not on file.   Social History Main Topics  . Smoking status: Never Smoker  . Smokeless tobacco: Never Used  . Alcohol use 0.6 - 1.2 oz/week    1 - 2 Glasses of wine per week  . Drug use: No  . Sexual activity: Yes    Birth control/ protection: Pill   Other Topics Concern  . 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 excision of vaginal septum  2008  . WISDOM TOOTH EXTRACTION  2011    Family History  Problem Relation Age of Onset  . Hyperlipidemia Mother   . Hashimoto's thyroiditis Maternal Grandmother   . Kidney cancer Maternal Grandmother   . Pulmonary embolism Maternal Grandmother   . Cancer Maternal Grandmother   . Diabetes Maternal Grandfather   . Heart disease Maternal Grandfather     CAD- living, ?hx of cabg  .  Hypertension Maternal Grandfather   . Hypertension Paternal Grandmother   . Stroke Paternal Grandmother   . COPD Neg Hx     Allergies  Allergen Reactions  . Cephalosporins Hives  . Penicillins Hives    Current Outpatient Prescriptions on File Prior to Visit  Medication Sig Dispense Refill  . fluticasone (FLONASE) 50 MCG/ACT nasal spray Place 1 spray into both nostrils daily. 48 g 1  . levothyroxine (SYNTHROID, LEVOTHROID) 50 MCG tablet TAKE 1 TABLET DAILY 90 tablet 1  . SUMAtriptan (IMITREX) 50 MG tablet May repeat in 2 hours if headache persists (max 2 tabs/24 hrs) 30 tablet 1  . TRI-LO-MARZIA 0.18/0.215/0.25 MG-25 MCG tab TAKE 1 TABLET DAILY 84 tablet 0   No current facility-administered medications on file prior to visit.     BP 131/83 (BP Location: Left Arm, Cuff Size: Normal)   Pulse 79   Temp 98.2 F (36.8 C) (Oral)   Resp 18   Ht 5\' 7"  (1.702 m)   Wt 133 lb 9.6 oz (60.6 kg)   LMP 01/10/2017   SpO2 100% Comment: room air  BMI 20.92 kg/m       Objective:   Physical Exam  Constitutional: She is  oriented to person, place, and time. She appears well-developed and well-nourished.  HENT:  Head: Normocephalic and atraumatic.  Cardiovascular: Normal rate, regular rhythm and normal heart sounds.   No murmur heard. Pulmonary/Chest: Effort normal and breath sounds normal. No respiratory distress. She has no wheezes.  Musculoskeletal: She exhibits no edema.  Neurological: She is alert and oriented to person, place, and time.  Skin: Skin is warm and dry.  Psychiatric: She has a normal mood and affect. Her behavior is normal. Judgment and thought content normal.          Assessment & Plan:  Fibrocystic breast change- advised pt to keep her follow up US in 3 months.

## 2017-01-31 NOTE — Progress Notes (Signed)
Pre visit review using our clinic review tool, if applicable. No additional management support is needed unless otherwise documented below in the visit note. 

## 2017-01-31 NOTE — Assessment & Plan Note (Signed)
Mild elevation of BP. Advised pt to continue healthy diet, exercise and maintain her weight. Monitor.

## 2017-03-16 ENCOUNTER — Encounter: Payer: Self-pay | Admitting: Obstetrics & Gynecology

## 2017-03-16 ENCOUNTER — Ambulatory Visit (INDEPENDENT_AMBULATORY_CARE_PROVIDER_SITE_OTHER): Payer: BLUE CROSS/BLUE SHIELD | Admitting: Obstetrics & Gynecology

## 2017-03-16 VITALS — BP 132/89 | HR 108 | Ht 65.0 in | Wt 131.0 lb

## 2017-03-16 DIAGNOSIS — Z01419 Encounter for gynecological examination (general) (routine) without abnormal findings: Secondary | ICD-10-CM | POA: Diagnosis not present

## 2017-03-16 DIAGNOSIS — Z1151 Encounter for screening for human papillomavirus (HPV): Secondary | ICD-10-CM | POA: Diagnosis not present

## 2017-03-16 DIAGNOSIS — D241 Benign neoplasm of right breast: Secondary | ICD-10-CM

## 2017-03-16 DIAGNOSIS — R87612 Low grade squamous intraepithelial lesion on cytologic smear of cervix (LGSIL): Secondary | ICD-10-CM

## 2017-03-16 MED ORDER — NORGESTIM-ETH ESTRAD TRIPHASIC 0.18/0.215/0.25 MG-25 MCG PO TABS: 1.0000 | ORAL_TABLET | Freq: Every day | ORAL | 3 refills | Status: DC

## 2017-03-16 NOTE — Progress Notes (Signed)
Subjective:     Jenny Eaton is a 26 y.o. female here for a routine exam.  Current complaints: Pt felt lump in right breast and hd a [place] ordered by her primary provider. She reports that she has been worried about her prev abnormal PAP for the entire year. She reports "that's how I am.  I worry a lot." Pt denies new sx. On OCPs   Gynecologic History Patient's last menstrual period was 03/10/2017. Contraception: OCP (estrogen/progesterone) Last Pap: 01/21/2016. Results were: LOW GRADE SQUAMOUS INTRAEPITHELIAL LESION: CIN-1/ HPV (LSIL). THERE ARE A FEW CELLS SUGGESTIVE OF A HIGHER GRADE LESION. CLINICAL CORRELATION IS RECOMMENDED.  03/03/2016 Diagnosis 1. Cervix, biopsy, 1 and 7 o'clock on the cervix - LOW GRADE SQUAMOUS INTRAEPITHELIAL LESION, CIN-I (MILD DYSPLASIA). 2. Endocervix, curettage - VERY SCANT BENIGN SQUAMOUS EPITHELIUM. - BENIGN ENDOCERVICAL EPITHELIUM. Last mammogram: n/a  01/14/2017 CLINICAL DATA:  Area of palpable concern in the right 10 o'clock breast, felt by the patient approximately 1 month ago.  EXAM: ULTRASOUND OF THE RIGHT BREAST  COMPARISON:  Previous exam(s).  FINDINGS: On physical exam, there is a relatively soft palpable 2 cm area in the right 10 o'clock breast, posterior depth.  Targeted ultrasound is performed, showing right breast 10 o'clock 5 cm from the nipple dense fibroglandular tissue with interspersed benign-appearing subcentimeter cysts, which has the appearance of focal fibrocystic changes. The area measures 2.1 x 0.4 x 2.1 cm.  IMPRESSION: Right breast 10 o'clock palpable area likely corresponds to an area of fibrocystic changes.  RECOMMENDATION: Ultrasound of the right breast in 3 months. (Code:US-R-66M)  I have discussed the findings and recommendations with the patient. Results were also provided in writing at the conclusion of the visit. If applicable, a reminder letter will be sent to the patient regarding the next  appointment.  BI-RADS CATEGORY  3: Probably benign  Obstetric History OB History  Gravida Para Term Preterm AB Living  0 0 0 0 0 0  SAB TAB Ectopic Multiple Live Births  0 0 0 0         The following portions of the patient's history were reviewed and updated as appropriate: allergies, current medications, past family history, past medical history, past social history, past surgical history and problem list.  Review of Systems Pertinent items are noted in HPI.    Objective:  BP 132/89   Pulse (!) 108   Ht 5\' 5"  (1.651 m)   Wt 131 lb (59.4 kg)   LMP 03/10/2017   BMI 21.80 kg/m  General Appearance:    Alert, cooperative, no distress, appears stated age  Head:    Normocephalic, without obvious abnormality, atraumatic  Eyes:    conjunctiva/corneas clear, EOM's intact, both eyes  Ears:    Normal external ear canals, both ears  Nose:   Nares normal, septum midline, mucosa normal, no drainage    or sinus tenderness  Throat:   Lips, mucosa, and tongue normal; teeth and gums normal  Neck:   Supple, symmetrical, trachea midline, no adenopathy;    thyroid:  no enlargement/tenderness/nodules  Back:     Symmetric, no curvature, ROM normal, no CVA tenderness  Lungs:     Clear to auscultation bilaterally, respirations unlabored  Chest Wall:    No tenderness or deformity   Heart:    Regular rate and rhythm, S1 and S2 normal, no murmur, rub   or gallop  Breast Exam:    Left breast no tenderness, masses, or nipple abnormality; right breast there is  some nodularity in the RUQ consistent with a fibroadenoma. The breat [place] was reviewed  Abdomen:     Soft, non-tender, bowel sounds active all four quadrants,    no masses, no organomegaly  Genitalia:    Normal female without lesion, discharge or tenderness     Extremities:   Extremities normal, atraumatic, no cyanosis or edema  Pulses:   2+ and symmetric all extremities  Skin:   Skin color, texture, turgor normal, no rashes or lesions      Assessment:    Healthy female exam.   H/o abnormal PAP 2017 with colpo and LGSIL on biopsy  Contraception: OCPs   Plan:    Follow up in: 1 year.    F/u PAP Refilled OCPs Repeat breast US in 3 months Stop all caffeine.  Aarav Burgett L. Harraway-Smith, M.D., Cherlynn June

## 2017-03-16 NOTE — Patient Instructions (Signed)
Fibroadenoma Fibroadenoma is a type of breast tumor that is not cancerous (is benign). These tumors are made up of breast tissue and the tissue that holds breast tissue together (connective tissue). There are several types of fibroadenomas:  Simple fibroadenoma. This is the most common type. It consists of a single type of tissue throughout the tumor.  Complex fibroadenoma. This type of tumor contains more than one kind of tissue or irregular tissue.  Juvenile fibroadenoma. This is a type of tumor that can develop in adolescent girls. It tends to grow larger over time than other adenomas. A fibroadenoma usually occurs as a single lump, but sometimes there may be more than one lump. Fibroadenomas vary in size. They can occur in one breast or in both breasts. Some fibroadenomas are too small to feel, but a larger one may feel like a firm, smooth lump that moves beneath your fingers. Although fibroadenomas are not cancer, having a fibroadenoma may slightly increase your risk for developing breast cancer in the future. What are the causes? The exact cause of fibroadenoma is not known. What increases the risk? This condition is more likely to develop in:  Women who are 58-50 years of age.  Women of African-American descent. What are the signs or symptoms? A fibroadenoma may not cause any symptoms. These tumors usually do not cause pain unless they grow to a large size. A fibroadenoma may feel like a lump in your breast that is:  Firm.  Round.  Smooth.  Slightly moveable. How is this diagnosed? You may notice a breast lump during a breast self-exam. Your health care provider may discover it during a routine breast exam or mammogram. Your health care provider may suspect fibroadenoma if you have a breast lump that feels firm, round, and smooth and appears smooth on your mammogram. Other tests may be done to confirm the diagnosis, including:  An ultrasound to check for fluid inside the lump  (cystic tumor).  A procedure that uses a needle to remove fluid from a cystic tumor. The fluid is then checked under a microscope for cancer cells.  A mammogram to examine a lump that is not cystic (is solid).  A procedure that uses a needle to remove a sample of tissue from the lump (breast biopsy) to examine under a microscope. This test is the only method that can be used to confirm that a tumor is a fibroadenoma and is not cancer. How is this treated? Treatment for this condition may include:  Having breast exams regularly to check for changes in your fibroadenoma.  Having the fibroadenoma removed. A fibroadenoma may be removed if it is:  Large.  Continuing to grow.  Causing symptoms.  Changing the appearance of your breast.  A juvenile fibroadenoma. These tend to grow large over time. Follow these instructions at home:   If you had a fibroadenoma removed, follow instructions from your health care provider for care after the procedure.  Perform breast self-exams at home as told by your health care provider.  Keep all follow-up visits as told by your health care provider. This is important. Contact a health care provider if:  Your fibroadenoma becomes larger, feels different, or becomes painful.  You find a new breast lump.  You have any changes in the skin that covers your breast. These include:  Dimpling.  Bruising.  Thickening.  Redness.  You have any changes in your nipple.  You have fluid leaking from your nipple. This information is not intended to  replace advice given to you by your health care provider. Make sure you discuss any questions you have with your health care provider. Document Released: 02/18/2015 Document Revised: 03/11/2016 Document Reviewed: 09/25/2014 Elsevier Interactive Patient Education  2017 [employer].

## 2017-03-16 NOTE — Addendum Note (Signed)
Addended by: Phill Myron on: 03/16/2017 03:46 PM   Modules accepted: Orders

## 2017-03-21 LAB — CYTOLOGY - PAP
Diagnosis: NEGATIVE
HPV (WINDOPATH): NOT DETECTED

## 2017-03-26 ENCOUNTER — Encounter: Payer: Self-pay | Admitting: Family

## 2017-03-28 MED ORDER — TERBINAFINE HCL 250 MG PO TABS: 250.0000 mg | ORAL_TABLET | Freq: Every day | ORAL | 0 refills | Status: DC

## 2017-08-01 ENCOUNTER — Encounter: Payer: Self-pay | Admitting: Family

## 2017-08-01 ENCOUNTER — Telehealth: Payer: Self-pay | Admitting: Family

## 2017-08-01 ENCOUNTER — Ambulatory Visit (INDEPENDENT_AMBULATORY_CARE_PROVIDER_SITE_OTHER): Payer: BLUE CROSS/BLUE SHIELD | Admitting: Family

## 2017-08-01 VITALS — BP 135/87 | HR 74 | Temp 98.4°F | Resp 16 | Ht 65.0 in | Wt 127.6 lb

## 2017-08-01 DIAGNOSIS — Z23 Encounter for immunization: Secondary | ICD-10-CM | POA: Diagnosis not present

## 2017-08-01 DIAGNOSIS — R030 Elevated blood-pressure reading, without diagnosis of hypertension: Secondary | ICD-10-CM | POA: Diagnosis not present

## 2017-08-01 DIAGNOSIS — B360 Pityriasis versicolor: Secondary | ICD-10-CM | POA: Diagnosis not present

## 2017-08-01 DIAGNOSIS — E039 Hypothyroidism, unspecified: Secondary | ICD-10-CM | POA: Diagnosis not present

## 2017-08-01 DIAGNOSIS — R21 Rash and other nonspecific skin eruption: Secondary | ICD-10-CM

## 2017-08-01 MED ORDER — FLUCONAZOLE 150 MG PO TABS: ORAL_TABLET | ORAL | 0 refills | Status: DC

## 2017-08-01 NOTE — Progress Notes (Signed)
Subjective:    Patient ID: Jenny Eaton, female    DOB: [birth year], 26 y.o.   MRN: [id number]  HPI  Pt presents today for follow up.  1) Hypothyroid- maintained on levothyroxine 50 mcg. Feels great on current dose of synthroid.  Lab Results  Component Value Date   TSH 2.18 01/03/2017   2) Elevated blood pressure reading- eats a low sodium diet.  BP Readings from Last 3 Encounters:  08/01/17 135/87  03/16/17 132/89  01/31/17 131/83   3) Rash- was treated with lamisil for fungal rash back in the spring.  Rash resolved. More recently rash returned. Tried selsun blue without improvement. Rash is itchy at times.    Review of Systems See HPI  Past Medical History:  Diagnosis Date  . History of Hashimoto thyroiditis 2010  . Migraines   . Thyroid disease    hypothyroid     Social History   Social History  . Marital status: Married    Spouse name: N/A  . Number of children: N/A  . Years of education: N/A   Occupational History  . Not on file.   Social History Main Topics  . Smoking status: Never Smoker  . Smokeless tobacco: Never Used  . Alcohol use 0.6 - 1.2 oz/week    1 - 2 Glasses of wine per week  . Drug use: No  . Sexual activity: Yes    Birth control/ protection: Pill   Other Topics Concern  . 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 excision of vaginal septum  2008  . WISDOM TOOTH EXTRACTION  2011    Family History  Problem Relation Age of Onset  . Hyperlipidemia Mother   . Hashimoto's thyroiditis Maternal Grandmother   . Kidney cancer Maternal Grandmother   . Pulmonary embolism Maternal Grandmother   . Cancer Maternal Grandmother   . Diabetes Maternal  Grandfather   . Heart disease Maternal Grandfather        CAD- living, ?hx of cabg  . Hypertension Maternal Grandfather   . Hypertension Paternal Grandmother   . Stroke Paternal Grandmother   . COPD Neg Hx     Allergies  Allergen Reactions  . Cephalosporins Hives  . Penicillins Hives    Current Outpatient Prescriptions on File Prior to Visit  Medication Sig Dispense Refill  . fluticasone (FLONASE) 50 MCG/ACT nasal spray Place 1 spray into both nostrils daily. (Patient taking differently: Place 1 spray into both nostrils daily as needed. ) 48 g 1  . levothyroxine (SYNTHROID, LEVOTHROID) 50 MCG tablet TAKE 1 TABLET DAILY 90 tablet 1  . Norgestimate-Ethinyl Estradiol Triphasic (TRI-LO-MARZIA) 0.18/0.215/0.25 MG-25 MCG tab Take 1 tablet by mouth daily. 84 tablet 3  . SUMAtriptan (IMITREX) 50 MG tablet May repeat in 2 hours if headache persists (max 2 tabs/24 hrs) 30 tablet 1   No current facility-administered medications on file prior to visit.     BP 135/87 (BP Location: Right Arm, Cuff Size: Normal)   Pulse 74   Temp 98.4 F (36.9 C) (Oral)   Resp 16   Ht 5\' 5"  (1.651 m)   Wt 127 lb 9.6 oz (57.9 kg)  LMP 08/01/2017   SpO2 100%   BMI 21.23 kg/m       Objective:   Physical Exam  Constitutional: She is oriented to person, place, and time. She appears well-developed and well-nourished.  Neurological: She is alert and oriented to person, place, and time.  Skin: Skin is warm and dry.  Hypopigmented rash on upper arms/neck/shoulders.              Assessment & Plan:  Tinea Versicolor- rx with diflucan.    Elevated blood pressure reading- Continue low sodium diet, exercise. Has family hx of HTN. She is at her ideal body weight.  Monitor for now.

## 2017-08-01 NOTE — Patient Instructions (Signed)
Please begin diflucan for Tinea Versicolor (fungla rash).   Call if rash worsens or if not improved in 2 weeks. Complete lab work prior to leaving. Continue regular exercise and low sodium diet.     Tinea Versicolor Tinea versicolor is a common fungal infection of the skin. It causes a rash that appears as light or dark patches on the skin. The rash most often occurs on the chest, back, neck, or upper arms. This condition is more common during warm weather. Other than affecting how your skin looks, tinea versicolor usually does not cause other problems. In most cases, the infection goes away in a few weeks with treatment. It may take a few months for the patches on your skin to clear up. What are the causes? Tinea versicolor occurs when a type of fungus that is normally present on the skin starts to overgrow. This fungus is a kind of yeast. The exact cause of the overgrowth is not known. This condition cannot be passed from one person to another (noncontagious). What increases the risk? This condition is more likely to develop when certain factors are present, such as:  Heat and humidity.  Sweating too much.  Hormone changes.  Oily skin.  A weak defense (immune) system.  What are the signs or symptoms? Symptoms of this condition may include:  A rash on your skin that is made up of light or dark patches. The rash may have: ? Patches of tan or pink spots on light skin. ? Patches of white or brown spots on dark skin. ? Patches of skin that do not tan. ? Well-marked edges. ? Scales on the discolored areas.  Mild itching.  How is this diagnosed? A health care provider can usually diagnose this condition by looking at your skin. During the exam, he or she may use ultraviolet light to help determine the extent of the infection. In some cases, a skin sample may be taken by scraping the rash. This sample will be viewed under a microscope to check for yeast overgrowth. How is this  treated? Treatment for this condition may include:  Dandruff shampoo that is applied to the affected skin during showers or bathing.  Over-the-counter medicated skin cream, lotion, or soaps.  Prescription antifungal medicine in the form of skin cream or pills.  Medicine to help reduce itching.  Follow these instructions at home:  Take medicines only as directed by your health care provider.  Apply dandruff shampoo to the affected area if told to do so by your health care provider. You may be instructed to scrub the affected skin for several minutes each day.  Do not scratch the affected area of skin.  Avoid hot and humid conditions.  Do not use tanning booths.  Try to avoid sweating a lot. Contact a health care provider if:  Your symptoms get worse.  You have a fever.  You have redness, swelling, or pain at the site of your rash.  You have fluid, blood, or pus coming from your rash.  Your rash returns after treatment. This information is not intended to replace advice given to you by your health care provider. Make sure you discuss any questions you have with your health care provider. Document Released: 10/01/2000 Document Revised: 06/06/2016 Document Reviewed: 07/16/2014 Elsevier Interactive Patient Education  2018 [employer].

## 2017-08-01 NOTE — Telephone Encounter (Signed)
Pt mother Leanne Chang filled out request for immunizations to take to [facility] [employer].

## 2017-08-02 LAB — TSH: TSH: 2.9 u[IU]/mL (ref 0.35–4.50)

## 2017-08-22 ENCOUNTER — Other Ambulatory Visit: Payer: Self-pay | Admitting: Family

## 2018-02-06 ENCOUNTER — Ambulatory Visit (INDEPENDENT_AMBULATORY_CARE_PROVIDER_SITE_OTHER): Payer: BLUE CROSS/BLUE SHIELD | Admitting: Family

## 2018-02-06 ENCOUNTER — Encounter: Payer: Self-pay | Admitting: Family

## 2018-02-06 VITALS — BP 137/83 | HR 81 | Temp 98.6°F | Resp 16 | Ht 65.0 in | Wt 129.4 lb

## 2018-02-06 DIAGNOSIS — Z0000 Encounter for general adult medical examination without abnormal findings: Secondary | ICD-10-CM

## 2018-02-06 DIAGNOSIS — R21 Rash and other nonspecific skin eruption: Secondary | ICD-10-CM

## 2018-02-06 NOTE — Progress Notes (Signed)
Subjective:    Patient ID: Jenny Eaton, female    DOB: 01/10/1991, 27 y.o.   MRN: [id number]  HPI  Patient presents today for complete physical.  Immunizations: 4/17 tdap Diet:  healthy Exercise: at home work outs.  Pap Smear: 5/18 Dental: up to date Eye exam:  <1 year  BP Readings from Last 3 Encounters:  02/06/18 137/83  08/01/17 135/87  03/16/17 132/89   Reports that her home BP readings: 117/76 yesterday,  Day before 637 systolic.   Review of Systems  Constitutional: Negative for unexpected weight change.  HENT: Negative for hearing loss and rhinorrhea.   Eyes: Negative for visual disturbance.  Respiratory: Negative for cough.   Cardiovascular: Negative for leg swelling.  Gastrointestinal: Negative for diarrhea, nausea and vomiting.  Genitourinary: Negative for dysuria, frequency and menstrual problem.  Musculoskeletal: Negative for arthralgias and myalgias.  Skin: Negative for rash.  Neurological: Negative for headaches.  Hematological: Negative for adenopathy.  Psychiatric/Behavioral:       Denies depression/anxiety   Past Medical History:  Diagnosis Date  . History of Hashimoto thyroiditis 2010  . Migraines   . Thyroid disease    hypothyroid     Social History   Socioeconomic History  . Marital status: Married    Spouse name: Not on file  . Number of children: Not on file  . Years of education: Not on file  . Highest education level: Not on file  Occupational History  . Not on file  Social Needs  . Financial resource strain: Not on file  . Food insecurity:    Worry: Not on file    Inability: Not on file  . Transportation needs:    Medical: Not on file    Non-medical: Not on file  Tobacco Use  . Smoking status: Never Smoker  . Smokeless tobacco: Never Used  Substance and Sexual Activity  . Alcohol use: Yes    Alcohol/week: 0.6 - 1.2 oz    Types: 1 - 2 Glasses of wine per week  . Drug use: No  . Sexual activity: Yes    Birth  control/protection: Pill  Lifestyle  . Physical activity:    Days per week: Not on file    Minutes per session: Not on file  . Stress: Not on file  Relationships  . Social connections:    Talks on phone: Not on file    Gets together: Not on file    Attends religious service: Not on file    Active member of club or organization: Not on file    Attends meetings of clubs or organizations: Not on file    Relationship status: Not on file  . Intimate partner violence:    Fear of current or ex partner: Not on file    Emotionally abused: Not on file    Physically abused: Not on file    Forced sexual activity: Not on file  Other Topics Concern  . 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 excision of vaginal septum  2008  . WISDOM TOOTH EXTRACTION  2011    Family History  Problem Relation Age of Onset  . Hyperlipidemia Mother   .  Hashimoto's thyroiditis Maternal Grandmother   . Kidney cancer Maternal Grandmother   . Pulmonary embolism Maternal Grandmother   . Cancer Maternal Grandmother   . Diabetes Maternal Grandfather   . Heart disease Maternal Grandfather        CAD- living, ?hx of cabg  . Hypertension Maternal Grandfather   . Hypertension Paternal Grandmother   . Stroke Paternal Grandmother   . COPD Neg Hx     Allergies  Allergen Reactions  . Cephalosporins Hives  . Penicillins Hives    Current Outpatient Medications on File Prior to Visit  Medication Sig Dispense Refill  . levothyroxine (SYNTHROID, LEVOTHROID) 50 MCG tablet TAKE 1 TABLET DAILY 90 tablet 1  . Norgestimate-Ethinyl Estradiol Triphasic (TRI-LO-MARZIA) 0.18/0.215/0.25 MG-25 MCG tab Take 1 tablet by mouth daily. 84 tablet 3  .  SUMAtriptan (IMITREX) 50 MG tablet May repeat in 2 hours if headache persists (max 2 tabs/24 hrs) 30 tablet 1   No current facility-administered medications on file prior to visit.     BP 137/83 (BP Location: Right Arm, Cuff Size: Normal)   Pulse 81   Temp 98.6 F (37 C) (Oral)   Resp 16   Ht 5\' 5"  (1.651 m)   Wt 129 lb 6.4 oz (58.7 kg)   LMP 01/12/2018   SpO2 100%   BMI 21.53 kg/m        Objective:   Physical Exam   Physical Exam  Constitutional: She is oriented to person, place, and time. She appears well-developed and well-nourished. No distress.  HENT:  Head: Normocephalic and atraumatic.  Right Ear: Tympanic membrane and ear canal normal.  Left Ear: Tympanic membrane and ear canal normal.  Mouth/Throat: Oropharynx is clear and moist.  Eyes: Pupils are equal, round, and reactive to light. No scleral icterus.  Neck: Normal range of motion. No thyromegaly present.  Cardiovascular: Normal rate and regular rhythm.   No murmur heard. Pulmonary/Chest: Effort normal and breath sounds normal. No respiratory distress. He has no wheezes. She has no rales. She exhibits no tenderness.  Musculoskeletal: She exhibits no edema.  Lymphadenopathy:    She has no cervical adenopathy.  Neurological: She is alert and oriented to person, place, and time. She has normal patellar reflexes. She exhibits normal muscle tone. Coordination normal.  Skin: Skin is warm and dry. hypopigmented patches noted on anterior chest/shoulders Psychiatric: She has a normal mood and affect. Her behavior is normal. Judgment and thought content normal.  Breast/pelvis: deferred to [place]:   Preventative care- continue healthy diet, exercise.  Obtain routine lab work. Pap and immunizations up to date.   Skin rash- unchanged, refer to dermatology.  Assessment & Plan:

## 2018-02-06 NOTE — Patient Instructions (Addendum)
Please complete lab work prior to leaving. You should be contacted about your referral to dermatology for your skin rash.  Continue healthy diet and regular exercise.

## 2018-02-07 LAB — BASIC METABOLIC PANEL
BUN: 12 mg/dL (ref 6–23)
CO2: 28 mEq/L (ref 19–32)
CREATININE: 0.69 mg/dL (ref 0.40–1.20)
Calcium: 9.3 mg/dL (ref 8.4–10.5)
Chloride: 104 mEq/L (ref 96–112)
GFR: 107.6 mL/min (ref >60.00)
Glucose, Bld: 92 mg/dL (ref 70–99)
POTASSIUM: 4 meq/L (ref 3.5–5.1)
Sodium: 137 mEq/L (ref 135–145)

## 2018-02-07 LAB — URINALYSIS, ROUTINE W REFLEX MICROSCOPIC
Bilirubin Urine: NEGATIVE
Ketones, ur: NEGATIVE
Leukocytes, UA: NEGATIVE
Nitrite: NEGATIVE
SPECIFIC GRAVITY, URINE: 1.015 (ref 1.000–1.030)
Total Protein, Urine: NEGATIVE
URINE GLUCOSE: NEGATIVE
Urobilinogen, UA: 0.2 (ref 0.0–1.0)
WBC UA: NONE SEEN (ref 0–?)
pH: 6.5 (ref 5.0–8.0)

## 2018-02-07 LAB — HEPATIC FUNCTION PANEL
ALT: 9 U/L (ref 0–35)
AST: 15 U/L (ref 0–37)
Albumin: 4.1 g/dL (ref 3.5–5.2)
Alkaline Phosphatase: 34 U/L — ABNORMAL LOW (ref 39–117)
BILIRUBIN DIRECT: 0 mg/dL (ref 0.0–0.3)
BILIRUBIN TOTAL: 0.2 mg/dL (ref 0.2–1.2)
Total Protein: 6.8 g/dL (ref 6.0–8.3)

## 2018-02-07 LAB — CBC WITH DIFFERENTIAL/PLATELET
Basophils Absolute: 0.1 10*3/uL (ref 0.0–0.1)
Basophils Relative: 0.8 % (ref 0.0–3.0)
EOS ABS: 0.1 10*3/uL (ref 0.0–0.7)
Eosinophils Relative: 1.4 % (ref 0.0–5.0)
HCT: 38.8 % (ref 36.0–46.0)
HEMOGLOBIN: 13.2 g/dL (ref 12.0–15.0)
Lymphocytes Relative: 34.1 % (ref 12.0–46.0)
Lymphs Abs: 2.5 10*3/uL (ref 0.7–4.0)
MCHC: 34 g/dL (ref 30.0–36.0)
MCV: 93.8 fl (ref 78.0–100.0)
Monocytes Absolute: 0.3 10*3/uL (ref 0.1–1.0)
Monocytes Relative: 4.7 % (ref 3.0–12.0)
Neutro Abs: 4.2 10*3/uL (ref 1.4–7.7)
Neutrophils Relative %: 59 % (ref 43.0–77.0)
Platelets: 233 10*3/uL (ref 150.0–400.0)
RBC: 4.13 Mil/uL (ref 3.87–5.11)
RDW: 12.5 % (ref 11.5–15.5)
WBC: 7.2 10*3/uL (ref 4.0–10.5)

## 2018-02-07 LAB — TSH: TSH: 2.36 u[IU]/mL (ref 0.35–4.50)

## 2018-02-07 LAB — LIPID PANEL
CHOL/HDL RATIO: 2
Cholesterol: 168 mg/dL (ref 0–200)
HDL: 76.6 mg/dL (ref >39.00)
LDL CALC: 74 mg/dL (ref 0–99)
NonHDL: 90.99
Triglycerides: 87 mg/dL (ref 0.0–149.0)
VLDL: 17.4 mg/dL (ref 0.0–40.0)

## 2018-02-08 ENCOUNTER — Encounter: Payer: Self-pay | Admitting: Family

## 2018-02-08 DIAGNOSIS — R3129 Other microscopic hematuria: Secondary | ICD-10-CM

## 2018-02-27 ENCOUNTER — Other Ambulatory Visit (INDEPENDENT_AMBULATORY_CARE_PROVIDER_SITE_OTHER): Payer: BLUE CROSS/BLUE SHIELD

## 2018-02-27 DIAGNOSIS — R3129 Other microscopic hematuria: Secondary | ICD-10-CM | POA: Diagnosis not present

## 2018-02-28 ENCOUNTER — Telehealth: Payer: Self-pay | Admitting: Family

## 2018-02-28 DIAGNOSIS — R3129 Other microscopic hematuria: Secondary | ICD-10-CM

## 2018-02-28 LAB — URINALYSIS, ROUTINE W REFLEX MICROSCOPIC
BILIRUBIN URINE: NEGATIVE
KETONES UR: NEGATIVE
LEUKOCYTES UA: NEGATIVE
Nitrite: NEGATIVE
PH: 6.5 (ref 5.0–8.0)
Specific Gravity, Urine: 1.025 (ref 1.000–1.030)
TOTAL PROTEIN, URINE-UPE24: NEGATIVE
UROBILINOGEN UA: 0.2 (ref 0.0–1.0)
Urine Glucose: NEGATIVE

## 2018-02-28 NOTE — Telephone Encounter (Signed)
See mychart.  

## 2018-03-02 ENCOUNTER — Other Ambulatory Visit: Payer: Self-pay | Admitting: Family

## 2018-03-03 NOTE — Telephone Encounter (Signed)
Let's repeat tsh in 6 months please.

## 2018-03-03 NOTE — Telephone Encounter (Signed)
Jenny Eaton-- pt last seen for cpe on 02/06/18 and scheduled next OV for 02/13/19.  Should she follow up any earlier for her thyroid? Sent levothyroxine refill to pharmacy.

## 2018-03-06 NOTE — Telephone Encounter (Signed)
Author sent myChart message asking pt. To call to make f/u appt. Six months from now per Debbrah Alar, NP request, for TSH f/u.

## 2018-03-22 ENCOUNTER — Ambulatory Visit (INDEPENDENT_AMBULATORY_CARE_PROVIDER_SITE_OTHER): Payer: BLUE CROSS/BLUE SHIELD | Admitting: Obstetrics & Gynecology

## 2018-03-22 ENCOUNTER — Encounter: Payer: Self-pay | Admitting: Obstetrics & Gynecology

## 2018-03-22 VITALS — BP 121/84 | HR 85 | Ht 65.0 in | Wt 128.0 lb

## 2018-03-22 DIAGNOSIS — Z124 Encounter for screening for malignant neoplasm of cervix: Secondary | ICD-10-CM | POA: Diagnosis not present

## 2018-03-22 DIAGNOSIS — Z01419 Encounter for gynecological examination (general) (routine) without abnormal findings: Secondary | ICD-10-CM | POA: Diagnosis not present

## 2018-03-22 NOTE — Progress Notes (Signed)
Subjective:     Jenny Eaton is a 27 y.o. female here for a routine exam. G0 Current complaints: pt was seen by primary care and she had microscopic blood in her urine.  This repeated twice. Not on menses at the time.  May stop OCPs in the Fall   Gynecologic History Patient's last menstrual period was 03/08/2018. Contraception: OCP (estrogen/progesterone) Last Pap: 02/2017. Results were:  Satisfactory for evaluation endocervical/transformation zone component PRESENT.    Diagnosis NEGATIVE FOR INTRAEPITHELIAL LESIONS OR MALIGNANCY.   HPV NOT DETECTED   Comment: Normal Reference Range - NOT Detected  Material Submitted CervicoVaginal Pap [ThinPrep Imaged]    2017- abnormal PaP with cells suggestive of a high grade lesion    Last mammogram: n/a.   Obstetric History OB History  Gravida Para Term Preterm AB Living  0 0 0 0 0 0  SAB TAB Ectopic Multiple Live Births  0 0 0 0      The following portions of the patient's history were reviewed and updated as appropriate: allergies, current medications, past family history, past medical history, past social history, past surgical history and problem list.  Review of Systems Pertinent items are noted in HPI.    Objective:  BP 121/84   Pulse 85   Ht 5\' 5"  (1.651 m)   Wt 128 lb (58.1 kg)   LMP 03/08/2018   BMI 21.30 kg/m   General Appearance:    Alert, cooperative, no distress, appears stated age  Head:    Normocephalic, without obvious abnormality, atraumatic  Eyes:    conjunctiva/corneas clear, EOM's intact, both eyes  Ears:    Normal external ear canals, both ears  Nose:   Nares normal, septum midline, mucosa normal, no drainage    or sinus tenderness  Throat:   Lips, mucosa, and tongue normal; teeth and gums normal  Neck:   Supple, symmetrical, trachea midline, no adenopathy;    thyroid:  no enlargement/tenderness/nodules  Back:     Symmetric, no curvature, ROM normal, no CVA tenderness  Lungs:     Clear to auscultation  bilaterally, respirations unlabored  Chest Wall:    No tenderness or deformity   Heart:    Regular rate and rhythm, S1 and S2 normal, no murmur, rub   or gallop  Breast Exam:    No tenderness, masses, or nipple abnormality  Abdomen:     Soft, non-tender, bowel sounds active all four quadrants,    no masses, no organomegaly  Genitalia:    Normal female without lesion, discharge or tenderness     Extremities:   Extremities normal, atraumatic, no cyanosis or edema  Pulses:   2+ and symmetric all extremities  Skin:   Skin color, texture, turgor normal, no rashes or lesions    Assessment:    Healthy female exam.   Contraception counseling- pt wants to cont on OCPs.  Preconception counseling- pt will stop OCPs in Sept. Given handout. Will start PNVs when she stops the OCPs.    Microscopic hematuria    Plan:    Follow up in: 1 year.    F/u PAP and hrHPV  Pt has referral to nephrology for eval of hematuria.   Derian Dimalanta L. Harraway-Smith, M.D., Cherlynn June

## 2018-03-22 NOTE — Progress Notes (Signed)
Last pap 5/18

## 2018-03-28 LAB — CYTOLOGY - PAP: DIAGNOSIS: NEGATIVE

## 2018-04-13 DIAGNOSIS — R311 Benign essential microscopic hematuria: Secondary | ICD-10-CM | POA: Diagnosis not present

## 2018-04-13 DIAGNOSIS — B360 Pityriasis versicolor: Secondary | ICD-10-CM | POA: Diagnosis not present

## 2018-04-19 ENCOUNTER — Encounter: Payer: Self-pay | Admitting: Obstetrics & Gynecology

## 2018-04-24 ENCOUNTER — Other Ambulatory Visit: Payer: Self-pay

## 2018-04-24 MED ORDER — NORGESTIM-ETH ESTRAD TRIPHASIC 0.18/0.215/0.25 MG-25 MCG PO TABS: 1.0000 | ORAL_TABLET | Freq: Every day | ORAL | 3 refills | Status: DC

## 2018-04-24 NOTE — Progress Notes (Signed)
Patient requesting refill of birth control. Patient states in my chart that she had discussed stopping it to start conceiving but would like to wait a little longer so she needs refill on birth control . Kathrene Alu RN

## 2018-04-26 DIAGNOSIS — R3129 Other microscopic hematuria: Secondary | ICD-10-CM | POA: Diagnosis not present

## 2018-04-26 DIAGNOSIS — R311 Benign essential microscopic hematuria: Secondary | ICD-10-CM | POA: Diagnosis not present

## 2018-05-11 DIAGNOSIS — R311 Benign essential microscopic hematuria: Secondary | ICD-10-CM | POA: Diagnosis not present

## 2018-08-15 IMAGING — US ULTRASOUND RIGHT BREAST LIMITED
1 series · 6 of 6 positions shown · non-contrast
Comparison: Previous exam(s).

CLINICAL DATA: Area of palpable concern in the right 10 o'clock
breast, felt by the patient approximately 1 month ago.

EXAM:
ULTRASOUND OF THE RIGHT BREAST

[Series 1: ultrasound right breast limited · 0.04mm/px · 6 of 6 slices shown]
[im 1/6]
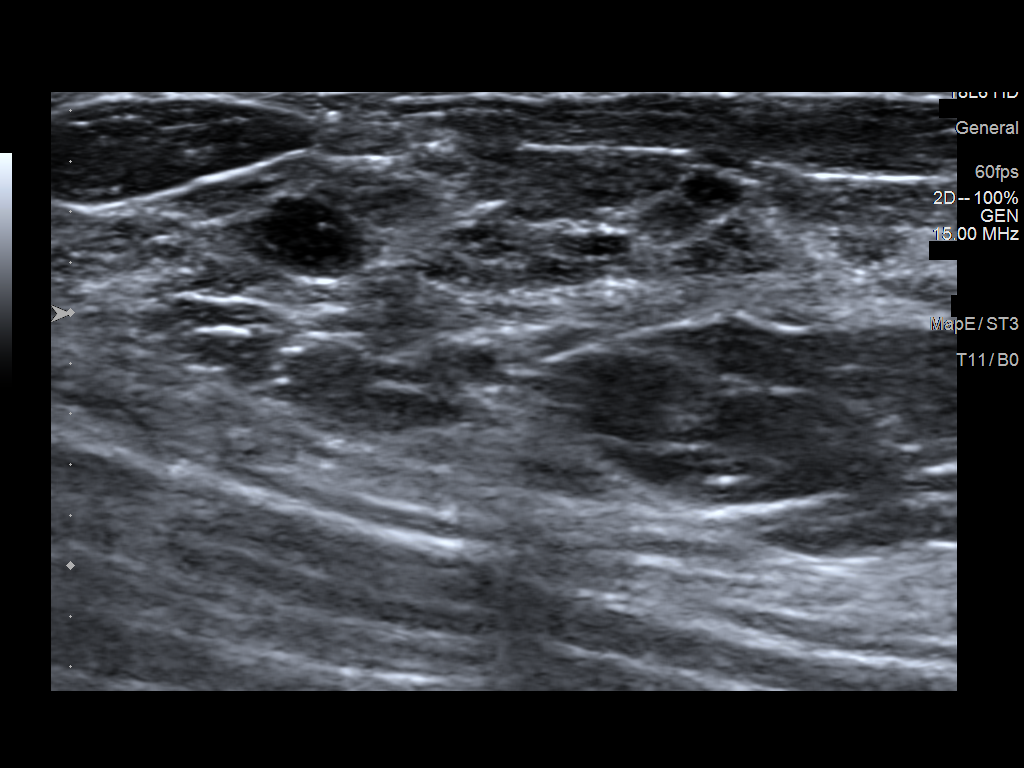
[im 2/6]
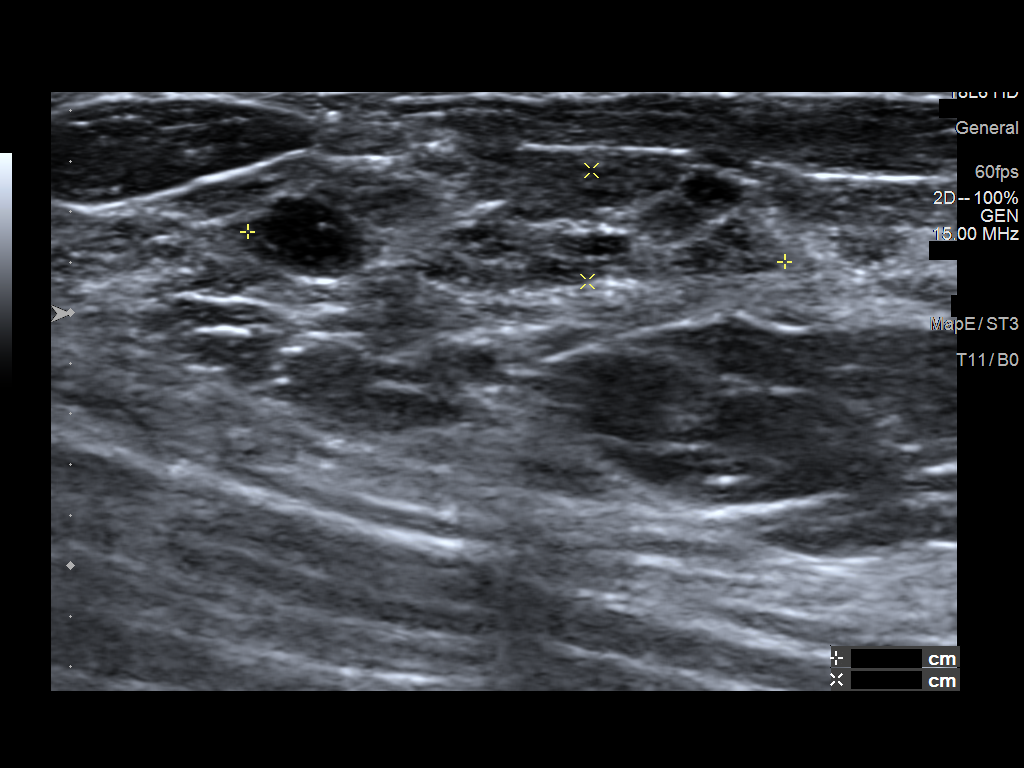
[im 3/6]
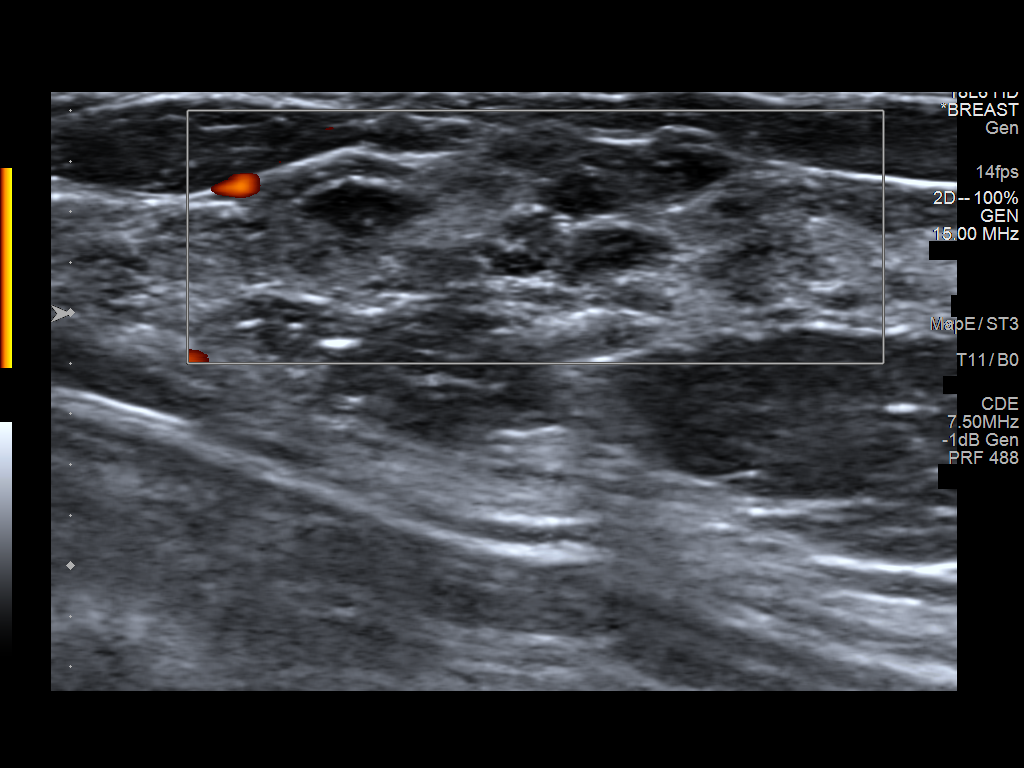
[im 4/6]
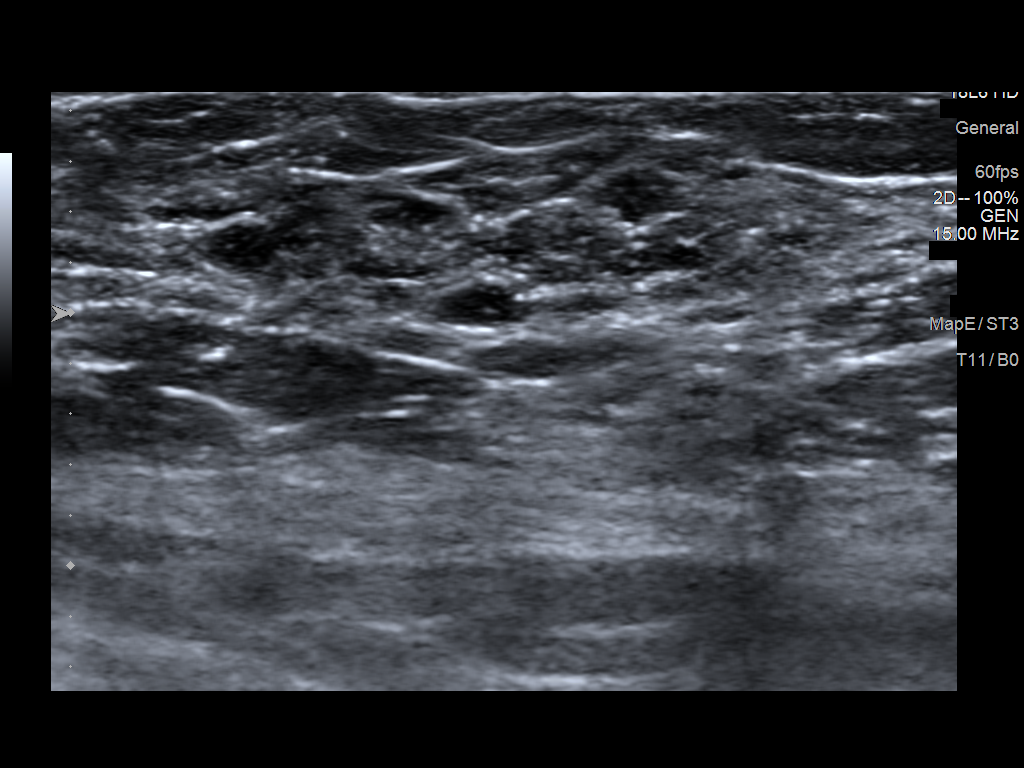
[im 5/6]
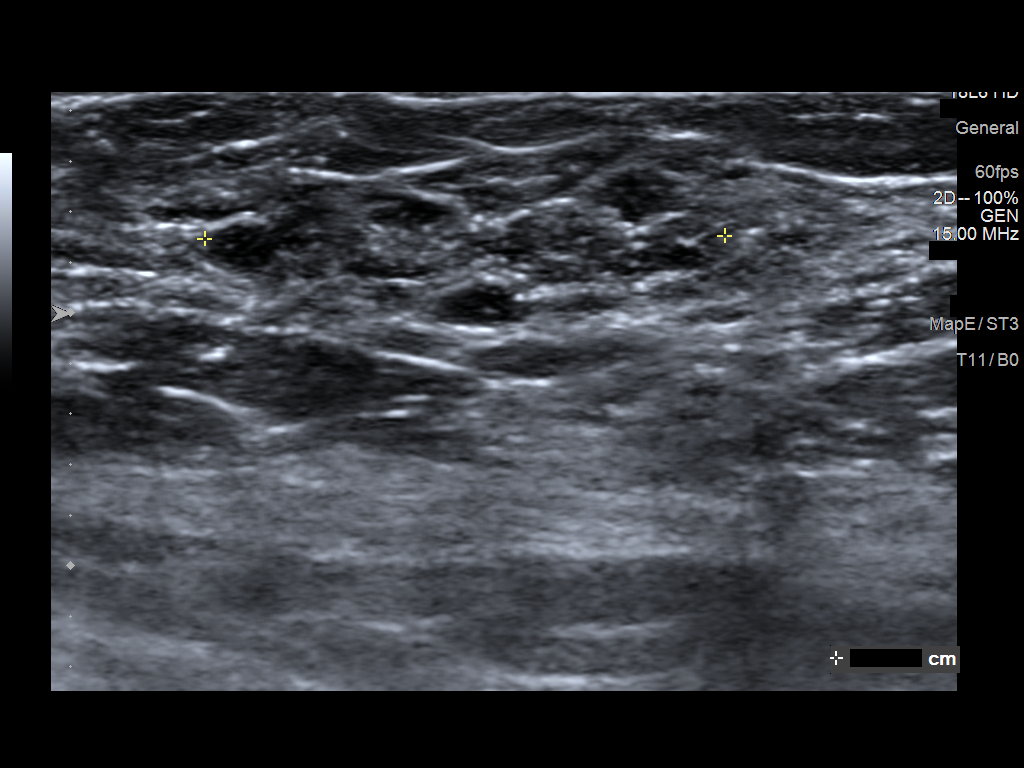
[im 6/6]
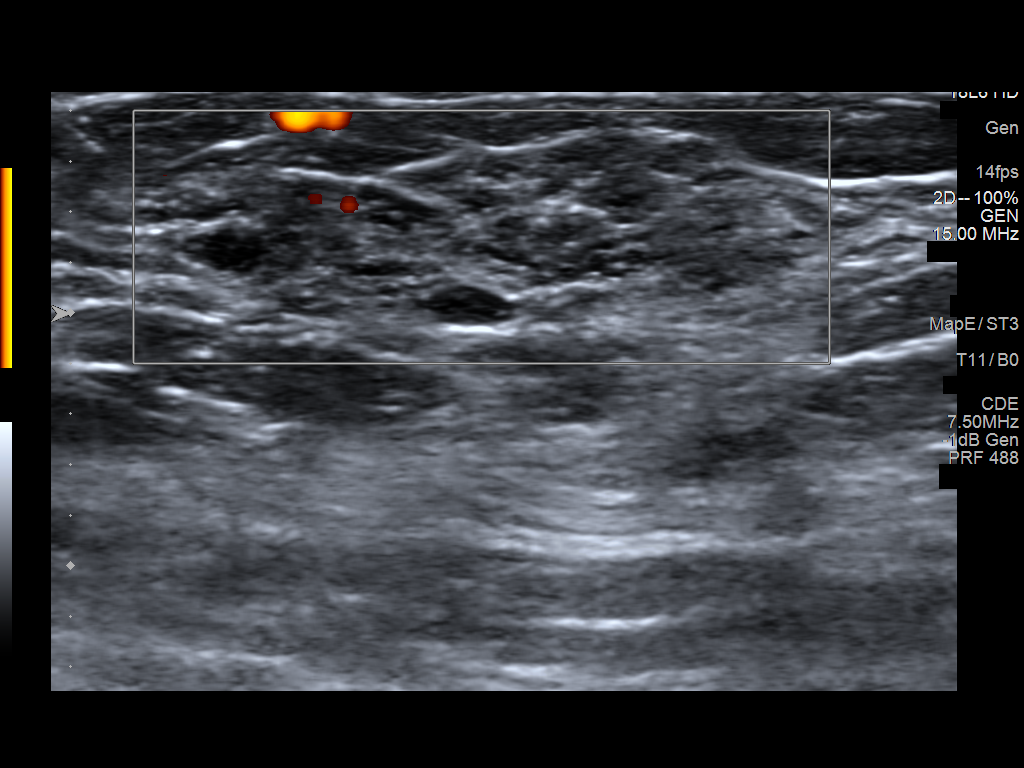

[6 of 6 positions shown; findings below may reference images not displayed]

FINDINGS: On physical exam, there is a relatively soft palpable 2 cm area in
the right 10 o'clock breast, posterior depth.

Targeted ultrasound is performed, showing right breast 10 o'clock 5
cm from the nipple dense fibroglandular tissue with interspersed
benign-appearing subcentimeter cysts, which has the appearance of
focal fibrocystic changes. The area measures 2.1 x 0.4 x 2.1 cm.
IMPRESSION: Right breast 10 o'clock palpable area likely corresponds to an area
of fibrocystic changes.

RECOMMENDATION:
Ultrasound of the right breast in 3 months. (Code:BV-5-77R)

I have discussed the findings and recommendations with the patient.
Results were also provided in writing at the conclusion of the
visit. If applicable, a reminder letter will be sent to the patient
regarding the next appointment.

BI-RADS CATEGORY  3: Probably benign.

## 2018-09-05 ENCOUNTER — Ambulatory Visit: Payer: BLUE CROSS/BLUE SHIELD | Admitting: Family

## 2018-09-05 ENCOUNTER — Encounter: Payer: Self-pay | Admitting: Family

## 2018-09-05 VITALS — BP 123/81 | HR 71 | Temp 98.6°F | Resp 16 | Ht 66.0 in | Wt 130.4 lb

## 2018-09-05 DIAGNOSIS — Z23 Encounter for immunization: Secondary | ICD-10-CM | POA: Diagnosis not present

## 2018-09-05 DIAGNOSIS — R3129 Other microscopic hematuria: Secondary | ICD-10-CM

## 2018-09-05 DIAGNOSIS — G43909 Migraine, unspecified, not intractable, without status migrainosus: Secondary | ICD-10-CM | POA: Diagnosis not present

## 2018-09-05 DIAGNOSIS — E039 Hypothyroidism, unspecified: Secondary | ICD-10-CM | POA: Diagnosis not present

## 2018-09-05 MED ORDER — LEVOTHYROXINE SODIUM 50 MCG PO TABS: 50.0000 ug | ORAL_TABLET | Freq: Every day | ORAL | 1 refills | Status: DC

## 2018-09-05 NOTE — Progress Notes (Signed)
Subjective:    Patient ID: Jenny Eaton, female    DOB: 08-14-1991, 27 y.o.   MRN: [id number]  HPI  Patient presents today for follow up of her hypothyroidism. She continues synthroid 50 mcg once daily.  Reports feeling well on this dose.   Lab Results  Component Value Date   TSH 2.36 02/06/2018   Migraine- no recent migraines.    Microscopic hematuria- reports that she completed work up with [facility] and was told no obvious cause.  (reviewed consultation report)   Review of Systems See HPI  Past Medical History:  Diagnosis Date  . History of Hashimoto thyroiditis 2010  . Microscopic hematuria    negative work up 2019  . Migraines   . Thyroid disease    hypothyroid     Social History   Socioeconomic History  . Marital status: Married    Spouse name: Not on file  . Number of children: Not on file  . Years of education: Not on file  . Highest education level: Not on file  Occupational History  . Not on file  Social Needs  . Financial resource strain: Not on file  . Food insecurity:    Worry: Not on file    Inability: Not on file  . Transportation needs:    Medical: Not on file    Non-medical: Not on file  Tobacco Use  . Smoking status: Never Smoker  . Smokeless tobacco: Never Used  Substance and Sexual Activity  . Alcohol use: Yes    Alcohol/week: 1.0 - 2.0 standard drinks    Types: 1 - 2 Glasses of wine per week  . Drug use: No  . Sexual activity: Yes    Birth control/protection: Pill  Lifestyle  . Physical activity:    Days per week: Not on file    Minutes per session: Not on file  . Stress: Not on file  Relationships  . Social connections:    Talks on phone: Not on file    Gets together: Not on file    Attends religious service: Not on file    Active member of club or organization: Not on file    Attends meetings of clubs or organizations: Not on file    Relationship status: Not on file  . Intimate partner violence:    Fear of  current or ex partner: Not on file    Emotionally abused: Not on file    Physically abused: Not on file    Forced sexual activity: Not on file  Other Topics Concern  . 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 excision of vaginal septum  2008  . WISDOM TOOTH EXTRACTION  2011    Family History  Problem Relation Age of Onset  . Hyperlipidemia Mother   . Hashimoto's thyroiditis Maternal Grandmother   . Kidney cancer Maternal Grandmother   . Pulmonary embolism Maternal Grandmother   . Cancer Maternal Grandmother   . Diabetes Maternal Grandfather   . Heart disease Maternal Grandfather        CAD- living, ?hx of cabg  . Hypertension Maternal Grandfather   . Hypertension Paternal Grandmother   .  Stroke Paternal Grandmother   . COPD Neg Hx     Allergies  Allergen Reactions  . Cephalosporins Hives  . Penicillins Hives    Current Outpatient Medications on File Prior to Visit  Medication Sig Dispense Refill  . SUMAtriptan (IMITREX) 50 MG tablet May repeat in 2 hours if headache persists (max 2 tabs/24 hrs) 30 tablet 1   No current facility-administered medications on file prior to visit.     BP 123/81 (BP Location: Right Arm, Cuff Size: Normal)   Pulse 71   Temp 98.6 F (37 C) (Oral)   Resp 16   Ht 5\' 6"  (1.676 m)   Wt 130 lb 6.4 oz (59.1 kg)   SpO2 100%   BMI 21.05 kg/m        Objective:   Physical Exam  Constitutional: She appears well-developed and well-nourished.  Cardiovascular: Normal rate, regular rhythm and normal heart sounds.  No murmur heard. Pulmonary/Chest: Effort normal and breath sounds normal. No respiratory distress. She has no wheezes.  Psychiatric: She has a  normal mood and affect. Her behavior is normal. Judgment and thought content normal.          Assessment & Plan:  Hypothyroid- clinically stable on synthroid, obtain TSH, continue same.  Migraines stable- continue prn imitrex.   Microscopic hematuria- completed work up - negative.

## 2018-09-06 LAB — TSH: TSH: 0.34 u[IU]/mL — AB (ref 0.35–4.50)

## 2018-09-07 ENCOUNTER — Telehealth: Payer: Self-pay | Admitting: Family

## 2018-09-07 DIAGNOSIS — R3129 Other microscopic hematuria: Secondary | ICD-10-CM | POA: Insufficient documentation

## 2018-09-07 DIAGNOSIS — E039 Hypothyroidism, unspecified: Secondary | ICD-10-CM

## 2018-09-07 MED ORDER — LEVOTHYROXINE SODIUM 50 MCG PO TABS: ORAL_TABLET | ORAL | 1 refills | Status: DC

## 2018-09-07 NOTE — Assessment & Plan Note (Signed)
Negative work up with [facility] 2019.

## 2018-09-07 NOTE — Telephone Encounter (Signed)
Please let pt know that I reviewed her lab work. It looks like we need to decrease her synthroid slighlty.  I would recommend that she take one tablet by mouth daily except take only 1/2 tab on Mondays and Fridays.  Let's repeat TSH in 6 weeks. Dx hypothyroid.

## 2018-09-08 NOTE — Telephone Encounter (Signed)
Left detailed message on voicemail and to call and schedule lab appt as below or let us know if any questions. Future lab order entered. [place] for [facility] / triage to discuss and schedule lab appt.

## 2018-09-12 NOTE — Telephone Encounter (Signed)
appt set. Patient is requesting results be posted to my chart

## 2018-11-01 ENCOUNTER — Other Ambulatory Visit (INDEPENDENT_AMBULATORY_CARE_PROVIDER_SITE_OTHER): Payer: BLUE CROSS/BLUE SHIELD

## 2018-11-01 DIAGNOSIS — E039 Hypothyroidism, unspecified: Secondary | ICD-10-CM | POA: Diagnosis not present

## 2018-11-01 LAB — TSH: TSH: 0.07 u[IU]/mL — AB (ref 0.35–4.50)

## 2018-11-02 ENCOUNTER — Other Ambulatory Visit: Payer: Self-pay | Admitting: Family

## 2018-11-02 NOTE — Telephone Encounter (Signed)
Lab work shows thyroid med needs further decrease in dose. Will decrease to 25 mcg once daily. Please repeat tsh in 4-6 weeks. Rx pended. Where would she like it sent?

## 2018-11-03 ENCOUNTER — Other Ambulatory Visit: Payer: Self-pay

## 2018-11-03 DIAGNOSIS — E039 Hypothyroidism, unspecified: Secondary | ICD-10-CM

## 2018-11-03 MED ORDER — LEVOTHYROXINE SODIUM 25 MCG PO TABS: 25.0000 ug | ORAL_TABLET | Freq: Every day | ORAL | 0 refills | Status: DC

## 2018-11-03 NOTE — Telephone Encounter (Signed)
Patient advised test result probably not related to birth control. She was advised to call back to schedule TSH, order entered as future.

## 2018-11-03 NOTE — Telephone Encounter (Signed)
Pt given results per notes of Jenny Alar NP on 11/02/18.Unable to document in result note due to result note not being routed to [facility]. Pt wanted to ask PCP - pt went off birth control pills in October. Pt asking if that could have anything to do with her thyroid?

## 2018-11-03 NOTE — Telephone Encounter (Signed)
Lm for patient to call back with pharmacy information and for results.

## 2018-11-03 NOTE — Telephone Encounter (Signed)
Also, please let pt know that I don't think that her thyroid test is related to her stopping the birth control.

## 2018-11-03 NOTE — Telephone Encounter (Signed)
Patient returned call and informed of message below patient confirmed   Jenny Eaton, Jenny Eaton [phone] (Phone) [phone] (Fax)   Patient transferred to [facility] Nurse Triage regarding lab results

## 2018-11-07 ENCOUNTER — Encounter: Payer: Self-pay | Admitting: Family

## 2018-11-07 DIAGNOSIS — B360 Pityriasis versicolor: Secondary | ICD-10-CM | POA: Diagnosis not present

## 2018-12-08 ENCOUNTER — Other Ambulatory Visit (INDEPENDENT_AMBULATORY_CARE_PROVIDER_SITE_OTHER): Payer: BLUE CROSS/BLUE SHIELD

## 2018-12-08 DIAGNOSIS — E039 Hypothyroidism, unspecified: Secondary | ICD-10-CM | POA: Diagnosis not present

## 2018-12-08 LAB — TSH: TSH: 6.54 mIU/L — ABNORMAL HIGH

## 2018-12-08 NOTE — Addendum Note (Signed)
Addended by: Caffie Pinto on: 12/08/2018 04:02 PM   Modules accepted: Orders

## 2018-12-10 ENCOUNTER — Telehealth: Payer: Self-pay | Admitting: Family

## 2018-12-10 DIAGNOSIS — E039 Hypothyroidism, unspecified: Secondary | ICD-10-CM

## 2018-12-10 MED ORDER — LEVOTHYROXINE SODIUM 25 MCG PO TABS: 37.5000 ug | ORAL_TABLET | Freq: Every day | ORAL | 0 refills | Status: DC

## 2018-12-10 NOTE — Telephone Encounter (Signed)
Left detailed message on machine as well as sent mychart.

## 2018-12-25 ENCOUNTER — Encounter: Payer: Self-pay | Admitting: Family

## 2019-01-03 ENCOUNTER — Ambulatory Visit: Payer: BLUE CROSS/BLUE SHIELD | Admitting: Internal Medicine

## 2019-01-03 ENCOUNTER — Encounter: Payer: Self-pay | Admitting: Internal Medicine

## 2019-01-03 ENCOUNTER — Other Ambulatory Visit: Payer: Self-pay

## 2019-01-03 VITALS — BP 122/86 | HR 101 | Temp 98.1°F | Wt 130.2 lb

## 2019-01-03 DIAGNOSIS — E039 Hypothyroidism, unspecified: Secondary | ICD-10-CM | POA: Diagnosis not present

## 2019-01-03 LAB — TSH: TSH: 4.45 u[IU]/mL (ref 0.35–4.50)

## 2019-01-03 LAB — T4, FREE: FREE T4: 0.88 ng/dL (ref 0.60–1.60)

## 2019-01-03 MED ORDER — LEVOTHYROXINE SODIUM 50 MCG PO TABS: 50.0000 ug | ORAL_TABLET | Freq: Every day | ORAL | 3 refills | Status: DC

## 2019-01-03 NOTE — Progress Notes (Signed)
Name: Jenny Eaton  MRN/ DOB: [id number], 23-Aug-1991    Age/ Sex: 28 y.o., female    PCP: Debbrah Alar, NP   Reason for Endocrinology Evaluation: Hypothyroidism     Date of Initial Endocrinology Evaluation: 01/03/2019     HPI: [name] is a 28 y.o. female with a past medical history of Migraine and hypothyroidism. The patient presented for initial endocrinology clinic visit on 01/03/2019 for consultative assistance with her hypothyroidism.   Pt has been diagnosed with hypothyroidism 2006, she was in 8th grade, she had hair loss, fatigue and dry eyes at the time.    She has been on levothyroxine 50 mcg daily for along time , she was also on OCP's but stopped them in October, 2019 and started prenatals and  that's when her TFT's became abnormal ,  Her PCP reduced the dose to 25 mcg daily with repeat TSH of 6.54 uIU/mL. Pt was supposed to take 37 mcg of levothyroxine but has been taking 50 mcg daily instead.   Pt stopped the prenatals since February.   She tends to forget her LT-4 replacement  On average once a week   Weight has been stable Denies anxiety, jitter sensation , palpitations or  diarrhea  She denies any local neck symptoms.   Periods have been  regular  Expected menstrual period is tomorrow    M[facility] in 2016     HISTORY:  Past Medical History:  Past Medical History:  Diagnosis Date  . History of Hashimoto thyroiditis 2010  . Microscopic hematuria    negative work up 2019  . Migraines   . Thyroid disease    hypothyroid   Past Surgical History:  Past Surgical History:  Procedure Laterality Date  . excision of vaginal septum  2008  . WISDOM TOOTH EXTRACTION  2011      Social History:  reports that she has never smoked. She has never used smokeless tobacco. She reports current alcohol use of about 1.0 - 2.0 standard drinks of alcohol per week. She reports that she does not use drugs.  Family History: family history includes  Cancer in her maternal grandmother; Diabetes in her maternal grandfather; Hashimoto's thyroiditis in her maternal grandmother; Heart disease in her maternal grandfather; Hyperlipidemia in her mother; Hypertension in her maternal grandfather and paternal grandmother; Kidney cancer in her maternal grandmother; Pulmonary embolism in her maternal grandmother; Stroke in her paternal grandmother.   HOME MEDICATIONS: Allergies as of 01/03/2019      Reactions   Cephalosporins Hives   Penicillins Hives      Medication List       Accurate as of January 03, 2019  4:13 PM. Always use your most recent med list.        levothyroxine 50 MCG tablet Commonly known as:  SYNTHROID, LEVOTHROID Take 1 tablet (50 mcg total) by mouth daily.   SUMAtriptan 50 MG tablet Commonly known as:  Imitrex May repeat in 2 hours if headache persists (max 2 tabs/24 hrs)         REVIEW OF SYSTEMS: A comprehensive ROS was conducted with the patient and is negative except as per HPI and below:  Review of Systems  Constitutional: Negative for fever and weight loss.  HENT: Negative for congestion and sore throat.   Respiratory: Negative for cough and shortness of breath.   Cardiovascular: Negative for chest pain and palpitations.  Gastrointestinal: Negative for diarrhea and nausea.  Genitourinary: Negative for frequency.  Neurological: Negative  for dizziness and tremors.  Endo/Heme/Allergies: Negative for polydipsia.  Psychiatric/Behavioral: Negative for depression. The patient is not nervous/anxious.        OBJECTIVE:  VS: BP 122/86 (BP Location: Left Arm, Patient Position: Sitting, Cuff Size: Normal)   Pulse (!) 101   Temp 98.1 F (36.7 C)   Wt 130 lb 3.2 oz (59.1 kg)   HC 66" (167.6 cm)   SpO2 98%   BMI 21.01 kg/m    Wt Readings from Last 3 Encounters:  01/03/19 130 lb 3.2 oz (59.1 kg)  09/05/18 130 lb 6.4 oz (59.1 kg)  03/22/18 128 lb (58.1 kg)     EXAM: General: Pt appears well and is in NAD   Hydration: Well-hydrated with moist mucous membranes and good skin turgor  Eyes: External eye exam normal without stare, lid lag or exophthalmos.  EOM intact.  PERRL.  Ears, Nose, Throat: Hearing: Grossly intact bilaterally Dental: Good dentition  Throat: Clear without mass, erythema or exudate  Neck: General: Supple without adenopathy. Thyroid: Thyroid size normal.  No goiter or nodules appreciated. No thyroid bruit.  Lungs: Clear with good BS bilat with no rales, rhonchi, or wheezes  Heart: Auscultation: RRR.  Abdomen: Normoactive bowel sounds, soft, nontender, without masses or organomegaly palpable  Extremities:  BL LE: No pretibial edema normal ROM and strength.  Skin: Hair: Texture and amount normal with gender appropriate distribution Skin Inspection: No rashes. Skin Palpation: Skin temperature, texture, and thickness normal to palpation  Neuro: Cranial nerves: II - XII grossly intact  Motor: Normal strength throughout DTRs: 2+ and symmetric in UE without delay in relaxation phase  Mental Status: Judgment, insight: Intact Orientation: Oriented to time, place, and person Mood and affect: No depression, anxiety, or agitation     DATA REVIEWED: Results for FLETA, BORGESON (MRN [id number]) as of 01/04/2019 08:10  Ref. Range 02/06/2018 17:26 09/05/2018 18:01 11/01/2018 07:32 12/08/2018 16:02 01/03/2019 16:03  TSH Latest Ref Range: 0.35 - 4.50 uIU/mL 2.36 0.34 (L) 0.07 (L) 6.54 (H) 4.45      ASSESSMENT/PLAN/RECOMMENDATIONS:   1. Hashimoto's Thyroiditis :  - Other than her tachycardia during her visit today, she has been clinically euthyroid - She been on Levothyroxine 50 mcg daily since 12/08/2018 - Pt is planning to conceive, we discussed the importance of euthyroid status on the wellbeing of the fetus.  - Pt educated extensively on the correct way to take levothyroxine (first thing in the morning with water, 30 minutes before eating or taking other medications). - Pt  encouraged to double dose the following day if she were to miss a dose given long half-life of levothyroxine. - Pt advised to increase levothyroxine by 2 tablets a week once pregnancy is confirmed.  - Repeat TFT's shows normalization of TSh but its only been 3 weeks on current dose, will recheck again in 4 weeks.   Medications : Levothyroxine 50 mcg daily      F/u in 3 months  Addendum: attempted to call the patient on 3/19, left her a message to check the portal as a  Message was sent .   Signed electronically by: Mack Guise, MD  [facility] Endocrinology  [facility] [address], [place] [address] Phone: [phone] FAX: [phone]   CC: Debbrah Alar, [address] [place] [address] Phone: [phone] Fax: [phone]   Return to Endocrinology clinic as below: Future Appointments  Date Time Provider [place]  02/13/2019  9:20 AM Debbrah Alar, NP [facility]  02/19/2019  4:00 PM Debbrah Alar, NP [facility]  04/04/2019  3:40 PM Amahri Dengel, Melanie Crazier, MD [facility] None

## 2019-01-03 NOTE — Patient Instructions (Addendum)
You are on levothyroxine - which is your thyroid hormone supplement. You MUST take this consistently.  You should take this first thing in the morning on an empty stomach with water. You should not take it with other medications. Wait 76min to 1hr prior to eating. If you are taking any vitamins - please take these in the evening.   If you miss a dose, please take your missed dose the following day (double the dose for that day). You should have a pill box for ONLY levothyroxine on your bedside table to help you remember to take your medications.   - Once you test positive for pregnancy, increase your levothyroxine dose by TWO tablets a WEEK and notify us.

## 2019-01-04 LAB — THYROID PEROXIDASE ANTIBODIES (TPO) (REFL): Thyroperoxidase Ab SerPl-aCnc: 190 IU/mL — ABNORMAL HIGH (ref <9)

## 2019-02-13 ENCOUNTER — Encounter: Payer: BLUE CROSS/BLUE SHIELD | Admitting: Family

## 2019-02-16 ENCOUNTER — Encounter: Payer: Self-pay | Admitting: Internal Medicine

## 2019-02-19 ENCOUNTER — Ambulatory Visit: Payer: BLUE CROSS/BLUE SHIELD | Admitting: Family

## 2019-03-21 ENCOUNTER — Other Ambulatory Visit: Payer: Self-pay

## 2019-03-21 NOTE — Progress Notes (Signed)
Name: Jacinta Penalver  MRN/ DOB: [id number], 19-Dec-1990    Age/ Sex: 28 y.o., female     PCP: Debbrah Alar, NP   Reason for Endocrinology Evaluation: Hypothyroidism     Initial Endocrinology Clinic Visit: 01/03/2019    PATIENT IDENTIFIER: Ms. Rolande Moe is a 28 y.o., female with a past medical history of migraine headaches and hypothyroidism. She has followed with [facility] since 01/03/2019 for consultative assistance with management of her hypothyroidism.   HISTORICAL SUMMARY: The patient was first diagnosed with hypothyroidism in 2006, she was in 8th grade at the time with hair loss, fatigue and dry eyes.   She has been on levothyroxine 50 mcg daily for a long time , she was also on OCP's but stopped them in October,2019 and started prenatals and that's when her TFT's became abnormal with a TSH of 0.34 uIU/mL. She was advised by her PCP to reduce the dose to 25 mcg with repeat TST of 6.54 uIU/mL.  Pt returned to taking 50 mcg daily of levothyroxine.    SUBJECTIVE:   During last visit (01/03/2019): TSH was 4.45 uIU/mL. She was maintained on 50 mcg daily .  Today (03/22/2019):  Ms. Summerall is here for a 3 month follow up appointment on her hypothyroidism. Since her last visit to our clinic she had a positive pregnancy test and was advised to increase Levothyroxine intake by 2 extra tabs a week.  She is currently at 10 weeks and 3 days of gestation    She has occasional nausea with rare vomiting.   She has constipation and bloating  She is compliant with LT-4 replacement , she takes it appropriately. She takes her prenatals at night.  She has been using a pill box.     ROS:  As per HPI.    HISTORY:  Past Medical History:  Past Medical History:  Diagnosis Date  . History of Hashimoto thyroiditis 2010  . Microscopic hematuria    negative work up 2019  . Migraines   . Thyroid disease    hypothyroid   Past Surgical History:  Past Surgical  History:  Procedure Laterality Date  . excision of vaginal septum  2008  . WISDOM TOOTH EXTRACTION  2011    Social History:  reports that she has never smoked. She has never used smokeless tobacco. She reports previous alcohol use. She reports that she does not use drugs. Family History:  Family History  Problem Relation Age of Onset  . Hyperlipidemia Mother   . Hashimoto's thyroiditis Maternal Grandmother   . Kidney cancer Maternal Grandmother   . Pulmonary embolism Maternal Grandmother   . Cancer Maternal Grandmother   . Diabetes Maternal Grandfather   . Heart disease Maternal Grandfather        CAD- living, ?hx of cabg  . Hypertension Maternal Grandfather   . Hypertension Paternal Grandmother   . Stroke Paternal Grandmother   . COPD Neg Hx      HOME MEDICATIONS: Allergies as of 03/22/2019      Reactions   Cephalosporins Hives   Penicillins Hives      Medication List       Accurate as of March 22, 2019 11:15 AM. If you have any questions, ask your nurse or doctor.        levothyroxine 50 MCG tablet Commonly known as:  SYNTHROID Take 1 tablet (50 mcg total) by mouth daily.   PRENATAL VITAMINS PO Take by mouth.   SUMAtriptan 50 MG tablet Commonly  known as:  Imitrex May repeat in 2 hours if headache persists (max 2 tabs/24 hrs)         OBJECTIVE:   PHYSICAL EXAM: VS: BP 116/72 (BP Location: Left Arm, Patient Position: Sitting, Cuff Size: Normal)   Pulse 90   Temp 97.8 F (36.6 C)   Ht 5\' 6"  (1.676 m)   Wt 130 lb 9.6 oz (59.2 kg)   LMP 01/08/2019 (Exact Date)   SpO2 98%   BMI 21.08 kg/m    EXAM: General: Pt appears well and is in NAD  Neck: General: Supple without adenopathy. Thyroid: Thyroid size normal.  No goiter or nodules appreciated. No thyroid bruit.  Lungs: Clear with good BS bilat with no rales, rhonchi, or wheezes  Heart: Auscultation: RRR.  Abdomen: Normoactive bowel sounds, soft, nontender, without masses or organomegaly palpable   Extremities:  BL LE: No pretibial edema normal ROM and strength.  Neuro: Cranial nerves: II - XII grossly intact  Motor: Normal strength throughout DTRs: 2+ and symmetric in UE without delay in relaxation phase  Mental Status: Judgment, insight: Intact Orientation: Oriented to time, place, and person Mood and affect: No depression, anxiety, or agitation     DATA REVIEWED: TSH pending    ASSESSMENT / PLAN / RECOMMENDATIONS:   1. Hypothyroidism:  - Pt is in 1st trimester at 10.3 weeks - She is compliant with LT-4 replacement.  - Pt educated extensively on the correct way to take levothyroxine (first thing in the morning with water, 30 minutes before eating or taking other medications). - Pt encouraged to double dose the following day if she were to miss a dose given long half-life of levothyroxine. - Awaiting on TSH results from today's labs at the [place] office  - TSH goal is ~ 2.5 uIU/mL   Medications   Levothyroxine 50 mcg 9 tablets weekly  F/u in 6 weeks     Signed electronically by: Mack Guise, MD  [facility] Endocrinology  [facility] [address] [place], [place] [address] Phone: [phone] FAX: [phone]      CC: Debbrah Alar, NP [address] [place] [address] Phone: [phone]  Fax: [phone]   Return to Endocrinology clinic as below: Future Appointments  Date Time Provider [place]  04/03/2019  8:15 AM Seabron Spates, CNM [facility] None  05/08/2019  9:10 AM , Melanie Crazier, MD [facility] None

## 2019-03-22 ENCOUNTER — Ambulatory Visit (INDEPENDENT_AMBULATORY_CARE_PROVIDER_SITE_OTHER): Payer: BLUE CROSS/BLUE SHIELD | Admitting: Internal Medicine

## 2019-03-22 ENCOUNTER — Encounter: Payer: Self-pay | Admitting: Internal Medicine

## 2019-03-22 ENCOUNTER — Ambulatory Visit (INDEPENDENT_AMBULATORY_CARE_PROVIDER_SITE_OTHER): Payer: BLUE CROSS/BLUE SHIELD | Admitting: Family Medicine

## 2019-03-22 ENCOUNTER — Encounter: Payer: Self-pay | Admitting: Family Medicine

## 2019-03-22 VITALS — BP 116/72 | HR 90 | Temp 97.8°F | Ht 66.0 in | Wt 130.6 lb

## 2019-03-22 VITALS — BP 117/82 | HR 101 | Wt 131.1 lb

## 2019-03-22 DIAGNOSIS — Z3A10 10 weeks gestation of pregnancy: Secondary | ICD-10-CM | POA: Diagnosis not present

## 2019-03-22 DIAGNOSIS — E039 Hypothyroidism, unspecified: Secondary | ICD-10-CM

## 2019-03-22 DIAGNOSIS — O26891 Other specified pregnancy related conditions, first trimester: Secondary | ICD-10-CM

## 2019-03-22 DIAGNOSIS — O3680X0 Pregnancy with inconclusive fetal viability, not applicable or unspecified: Secondary | ICD-10-CM

## 2019-03-22 DIAGNOSIS — Z3400 Encounter for supervision of normal first pregnancy, unspecified trimester: Secondary | ICD-10-CM | POA: Insufficient documentation

## 2019-03-22 DIAGNOSIS — G43809 Other migraine, not intractable, without status migrainosus: Secondary | ICD-10-CM

## 2019-03-22 DIAGNOSIS — K581 Irritable bowel syndrome with constipation: Secondary | ICD-10-CM

## 2019-03-22 NOTE — Progress Notes (Signed)
DATING AND VIABILITY SONOGRAM   Makaila Windle is a 28 y.o. year old G1P0000 with LMP Patient's last menstrual period was 01/08/2019 (exact date). which would correlate to  [redacted]w[redacted]d weeks gestation.  She has regular menstrual cycles.   She is here today for a confirmatory initial sonogram.    GESTATION: SINGLETON     FETAL ACTIVITY:          Heart rate         162          The fetus is active.  ADNEXA: The ovaries are normal.   GESTATIONAL AGE AND  BIOMETRICS:  Gestational criteria: Estimated Date of Delivery: 10/15/19 by LMP now at [redacted]w[redacted]d  Previous Scans:0  GESTATIONAL SAC           6.33cm         11 weeks  CROWN RUMP LENGTH           3.09 cm         10-2 weeks                                                                               AVERAGE EGA(BY THIS SCAN):  10-2 weeks  WORKING EDD( LMP ):  10-15-2019     TECHNICIAN COMMENTS:  Patient informed that the ultrasound is considered a limited obstetric ultrasound and is not intended to be a complete ultrasound exam. Patient also informed that the ultrasound is not being completed with the intent of assessing for fetal or placental anomalies or any pelvic abnormalities. Explained that the purpose of today's ultrasound is to assess for fetal heart rate. Patient acknowledges the purpose of the exam and the limitations of the study.    Kathrene Alu 03/22/2019 8:54 AM

## 2019-03-22 NOTE — Progress Notes (Signed)
Subjective:  Jenny Eaton is a G1P0000 [redacted]w[redacted]d being seen today for her first obstetrical visit.  Her obstetrical history is significant for hypothyroidism, first pregnancy. This is a planned pregnancy. FOB is patient's husband. Patient is uncertain about breast feeding. Pregnancy history fully reviewed.  Patient reports constipation.  BP 117/82   Pulse (!) 101   Wt 131 lb 1.3 oz (59.5 kg)   LMP 01/08/2019 (Exact Date)   BMI 21.16 kg/m   HISTORY: OB History  Gravida Para Term Preterm AB Living  1 0 0 0 0 0  SAB TAB Ectopic Multiple Live Births  0 0 0 0      # Outcome Date GA Lbr Len/2nd Weight Sex Delivery Anes PTL Lv  1 Current             Past Medical History:  Diagnosis Date  . History of Hashimoto thyroiditis 2010  . Microscopic hematuria    negative work up 2019  . Migraines   . Thyroid disease    hypothyroid    Past Surgical History:  Procedure Laterality Date  . excision of vaginal septum  2008  . WISDOM TOOTH EXTRACTION  2011    Family History  Problem Relation Age of Onset  . Hyperlipidemia Mother   . Hashimoto's thyroiditis Maternal Grandmother   . Kidney cancer Maternal Grandmother   . Pulmonary embolism Maternal Grandmother   . Cancer Maternal Grandmother   . Diabetes Maternal Grandfather   . Heart disease Maternal Grandfather        CAD- living, ?hx of cabg  . Hypertension Maternal Grandfather   . Hypertension Paternal Grandmother   . Stroke Paternal Grandmother   . COPD Neg Hx      Exam  BP 117/82   Pulse (!) 101   Wt 131 lb 1.3 oz (59.5 kg)   LMP 01/08/2019 (Exact Date)   BMI 21.16 kg/m   CONSTITUTIONAL: Well-developed, well-nourished female in no acute distress.  HENT:  Normocephalic, atraumatic, External right and left ear normal. Oropharynx is clear and moist EYES: Conjunctivae and EOM are normal. Pupils are equal, round, and reactive to light. No scleral icterus.  NECK: Normal range of motion, supple, no masses.  Normal  thyroid.  CARDIOVASCULAR: Normal heart rate noted, regular rhythm RESPIRATORY: Clear to auscultation bilaterally. Effort and breath sounds normal, no problems with respiration noted. BREASTS: Symmetric in size. No masses, skin changes, nipple drainage, or lymphadenopathy. ABDOMEN: Soft, normal bowel sounds, no distention noted.  No tenderness, rebound or guarding.  PELVIC: Normal appearing external genitalia; normal appearing vaginal mucosa and cervix. No abnormal discharge noted. Normal uterine size, no other palpable masses, no uterine or adnexal tenderness. MUSCULOSKELETAL: Normal range of motion. No tenderness.  No cyanosis, clubbing, or edema.  2+ distal pulses. SKIN: Skin is warm and dry. No rash noted. Not diaphoretic. No erythema. No pallor. NEUROLOGIC: Alert and oriented to person, place, and time. Normal reflexes, muscle tone coordination. No cranial nerve deficit noted. PSYCHIATRIC: Normal mood and affect. Normal behavior. Normal judgment and thought content.    Assessment:    Pregnancy: G1P0000 Patient Active Problem List   Diagnosis Date Noted  . Supervision of normal first pregnancy, antepartum 03/22/2019  . Microscopic hematuria 09/07/2018  . Elevated blood pressure reading 01/31/2017  . Abnormal Pap smear of cervix 01/24/2016  . Irritable bowel syndrome (IBS) 01/21/2016  . Preventative health care 01/21/2016  . Hypothyroidism 10/21/2015  . Migraines 10/21/2015      Plan:   1. Supervision of  normal first pregnancy, antepartum Patient would like panorama PAP done today. Discussed nature of practice, use of midwives, physicians. - CH[facility] AMB BABYSCRIPTS SCHEDULE OPTIMIZATION - Obstetric Panel, Including HIV - POC Urinalysis Dipstick OB - Cytology - PAP( ) - TSH  2. Hypothyroidism, unspecified type Check TSH. Usually stable, but had some issues since being off birth control - TSH  3. Other migraine without status migrainosus, not intractable Occasional  headache. Discussed use of tylenol. Patient to call back if needs anything stronger, like flexeril  4. Irritable bowel syndrome with constipation Miralax     Problem list reviewed and updated. 75% of 30 min visit spent on counseling and coordination of care.     Truett Mainland 03/22/2019

## 2019-03-22 NOTE — Patient Instructions (Signed)

## 2019-03-23 ENCOUNTER — Ambulatory Visit: Payer: BLUE CROSS/BLUE SHIELD | Admitting: Internal Medicine

## 2019-03-24 LAB — OBSTETRIC PANEL, INCLUDING HIV
Antibody Screen: NEGATIVE
Basophils Absolute: 0 10*3/uL (ref 0.0–0.2)
Basos: 0 %
EOS (ABSOLUTE): 0.1 10*3/uL (ref 0.0–0.4)
Eos: 1 %
HIV Screen 4th Generation wRfx: NONREACTIVE
Hematocrit: 35.9 % (ref 34.0–46.6)
Hemoglobin: 12.2 g/dL (ref 11.1–15.9)
Hepatitis B Surface Ag: NEGATIVE
Immature Grans (Abs): 0 10*3/uL (ref 0.0–0.1)
Immature Granulocytes: 0 %
Lymphocytes Absolute: 1.7 10*3/uL (ref 0.7–3.1)
Lymphs: 23 %
MCH: 31.9 pg (ref 26.6–33.0)
MCHC: 34 g/dL (ref 31.5–35.7)
MCV: 94 fL (ref 79–97)
Monocytes Absolute: 0.4 10*3/uL (ref 0.1–0.9)
Monocytes: 5 %
Neutrophils Absolute: 5.1 10*3/uL (ref 1.4–7.0)
Neutrophils: 71 %
Platelets: 235 10*3/uL (ref 150–450)
RBC: 3.83 x10E6/uL (ref 3.77–5.28)
RDW: 11.8 % (ref 11.7–15.4)
RPR Ser Ql: NONREACTIVE
Rh Factor: POSITIVE
Rubella Antibodies, IGG: 3.62 index (ref >0.99)
WBC: 7.2 10*3/uL (ref 3.4–10.8)

## 2019-03-24 LAB — TSH: TSH: 3.74 u[IU]/mL (ref 0.450–4.500)

## 2019-03-26 ENCOUNTER — Encounter: Payer: Self-pay | Admitting: Internal Medicine

## 2019-03-26 ENCOUNTER — Telehealth: Payer: Self-pay | Admitting: Internal Medicine

## 2019-03-26 LAB — CYTOLOGY - PAP
Chlamydia: NEGATIVE
Diagnosis: NEGATIVE
Neisseria Gonorrhea: NEGATIVE

## 2019-03-26 MED ORDER — LEVOTHYROXINE SODIUM 50 MCG PO TABS: 50.0000 ug | ORAL_TABLET | ORAL | 3 refills | Status: DC

## 2019-03-26 NOTE — Telephone Encounter (Signed)
TSH 3.7 uIU/mL    Recommednations   Increase Levothyroxine to 10 tablets weekly      Abby Nena Jordan, MD  [facility] Endocrinology  [facility] [address], [place] [address] Phone: [phone] FAX: [phone]

## 2019-04-03 ENCOUNTER — Other Ambulatory Visit: Payer: Self-pay

## 2019-04-03 ENCOUNTER — Encounter: Payer: Self-pay | Admitting: Advanced Practice Midwife

## 2019-04-03 ENCOUNTER — Encounter: Payer: BLUE CROSS/BLUE SHIELD | Admitting: Advanced Practice Midwife

## 2019-04-03 ENCOUNTER — Ambulatory Visit (INDEPENDENT_AMBULATORY_CARE_PROVIDER_SITE_OTHER): Payer: BLUE CROSS/BLUE SHIELD | Admitting: Advanced Practice Midwife

## 2019-04-03 VITALS — BP 133/71 | HR 113 | Wt 131.0 lb

## 2019-04-03 DIAGNOSIS — Z3400 Encounter for supervision of normal first pregnancy, unspecified trimester: Secondary | ICD-10-CM

## 2019-04-03 DIAGNOSIS — O26891 Other specified pregnancy related conditions, first trimester: Secondary | ICD-10-CM

## 2019-04-03 DIAGNOSIS — E039 Hypothyroidism, unspecified: Secondary | ICD-10-CM

## 2019-04-03 DIAGNOSIS — K581 Irritable bowel syndrome with constipation: Secondary | ICD-10-CM

## 2019-04-03 DIAGNOSIS — Z3482 Encounter for supervision of other normal pregnancy, second trimester: Secondary | ICD-10-CM | POA: Diagnosis not present

## 2019-04-03 DIAGNOSIS — Z3A12 12 weeks gestation of pregnancy: Secondary | ICD-10-CM

## 2019-04-03 NOTE — Progress Notes (Signed)
   PRENATAL VISIT NOTE  Subjective:  Jenny Eaton is a 28 y.o. G1P0000 at [redacted]w[redacted]d being seen today for ongoing prenatal care.  She is currently monitored for the following issues for this low-risk pregnancy and has Hypothyroidism; Migraines; Irritable bowel syndrome (IBS); Preventative health care; Abnormal Pap smear of cervix; Elevated blood pressure reading; Microscopic hematuria; and Supervision of normal first pregnancy, antepartum on their problem list.  Patient reports no complaints.  Contractions: Not present. Vag. Bleeding: None.  Movement: Absent. Denies leaking of fluid.   The following portions of the patient's history were reviewed and updated as appropriate: allergies, current medications, past family history, past medical history, past social history, past surgical history and problem list.   Objective:   Vitals:   04/03/19 0850  BP: 133/71  Pulse: (!) 113  Weight: 59.4 kg    Fetal Status: Fetal Heart Rate (bpm): 158 Fundal Height: 12 cm Movement: Absent     General:  Alert, oriented and cooperative. Patient is in no acute distress.  Skin: Skin is warm and dry. No rash noted.   Cardiovascular: Normal heart rate noted  Respiratory: Normal respiratory effort, no problems with respiration noted  Abdomen: Soft, gravid, appropriate for gestational age.  Pain/Pressure: Absent     Pelvic: Cervical exam deferred        Extremities: Normal range of motion.  Edema: None  Mental Status: Normal mood and affect. Normal behavior. Normal judgment and thought content.   Assessment and Plan:  Pregnancy: G1P0000 at [redacted]w[redacted]d 1. Supervision of normal first pregnancy, antepartum      Panorama today - [place] MFM OB DETAIL +14 WK; Future - Genetic Screening  Reviewed [facility], [facility], and how our practice works with personnel, emergencies, delivery, visitors, etc.  2. Hypothyroidism, unspecified type      Followed by Endocrinologist.  They are adjusting meds  3. Irritable bowel syndrome with  constipation     Stable   Preterm labor symptoms and general obstetric precautions including but not limited to vaginal bleeding, contractions, leaking of fluid and fetal movement were reviewed in detail with the patient. Please refer to After Visit Summary for other counseling recommendations.   Return in about 4 weeks (around 05/01/2019) for lab visit- AFP.  Future Appointments  Date Time Provider [place]  05/03/2019  8:15 AM [facility] NURSE [facility] None  05/08/2019  9:10 AM Shamleffer, Melanie Crazier, MD [facility] None  05/21/2019  8:45 AM [place] 2 WH-[facility]    Hansel Feinstein, CNM

## 2019-04-03 NOTE — Patient Instructions (Signed)

## 2019-04-04 ENCOUNTER — Ambulatory Visit: Payer: BLUE CROSS/BLUE SHIELD | Admitting: Internal Medicine

## 2019-04-10 ENCOUNTER — Telehealth: Payer: Self-pay

## 2019-04-10 NOTE — Telephone Encounter (Signed)
Patient called and made aware that Panorama is within normal limits. Kathrene Alu RN

## 2019-05-03 ENCOUNTER — Other Ambulatory Visit: Payer: Self-pay

## 2019-05-03 ENCOUNTER — Other Ambulatory Visit: Payer: BC Managed Care – PPO

## 2019-05-03 DIAGNOSIS — E039 Hypothyroidism, unspecified: Secondary | ICD-10-CM | POA: Diagnosis not present

## 2019-05-03 DIAGNOSIS — Z3400 Encounter for supervision of normal first pregnancy, unspecified trimester: Secondary | ICD-10-CM

## 2019-05-03 NOTE — Progress Notes (Signed)
Patient sent to lab for AFP blood draw. Kathrene Alu RN

## 2019-05-06 LAB — AFP TETRA
DIA Mom Value: 1.2
DIA Value (EIA): 218.38 pg/mL
DSR (By Age)    1 IN: 724
DSR (Second Trimester) 1 IN: 5226
Gestational Age: 16.3 WEEKS
MSAFP Mom: 1.28
MSAFP: 47 ng/mL
MSHCG Mom: 0.94
MSHCG: 40882 m[IU]/mL
Maternal Age At EDD: 29.7 yr
Osb Risk: 5096
T18 (By Age): 1:2821 {titer}
Test Results:: NEGATIVE
Weight: 132 [lb_av]
uE3 Mom: 1.09
uE3 Value: 1.08 ng/mL

## 2019-05-08 ENCOUNTER — Ambulatory Visit: Payer: BC Managed Care – PPO | Admitting: Internal Medicine

## 2019-05-08 ENCOUNTER — Other Ambulatory Visit: Payer: Self-pay

## 2019-05-08 ENCOUNTER — Encounter: Payer: Self-pay | Admitting: Internal Medicine

## 2019-05-08 VITALS — BP 102/62 | HR 102 | Temp 98.1°F | Ht 66.0 in | Wt 135.2 lb

## 2019-05-08 DIAGNOSIS — E039 Hypothyroidism, unspecified: Secondary | ICD-10-CM | POA: Diagnosis not present

## 2019-05-08 LAB — T4, FREE: Free T4: 0.69 ng/dL (ref 0.60–1.60)

## 2019-05-08 LAB — TSH: TSH: 1.44 u[IU]/mL (ref 0.35–4.50)

## 2019-05-08 NOTE — Progress Notes (Signed)
Name: Jenny Eaton  MRN/ DOB: [id number], [birth year]    Age/ Sex: 28 y.o., female     PCP: Debbrah Alar, NP   Reason for Endocrinology Evaluation: Hypothyroidism     Initial Endocrinology Clinic Visit: 01/03/2019    PATIENT IDENTIFIER: Jenny Eaton is a 28 y.o., female with a past medical history of migraine headaches and hypothyroidism. She has followed with [facility] since 01/03/2019 for consultative assistance with management of her hypothyroidism.   HISTORICAL SUMMARY: The patient was first diagnosed with hypothyroidism in 2006, she was in 8th grade at the time with hair loss, fatigue and dry eyes.   She has been on levothyroxine 50 mcg daily for a long time , she was also on OCP's but stopped them in October,2019 and started prenatals and that's when her TFT's became abnormal with a TSH of 0.34 uIU/mL. She was advised by her PCP to reduce the dose to 25 mcg with repeat TST of 6.54 uIU/mL.    SUBJECTIVE:   During last visit (01/03/2019): TSH was 4.45 uIU/mL. She was maintained on 50 mcg daily .  Today (05/08/2019):  Jenny Eaton is here for a 6 week  follow up appointment on her hypothyroidism. She is currently at 17.1 weeks of gestation.   EDD 10/15/2019 She is currently on 10 tablets a week of levothyroxine   Denies nausea and vomiting   Denies abdominal pain Denies spotting , has constipation.   She is compliant with LT-4 replacement , she takes it appropriately. She takes her prenatals at night.  She has been using a pill box.     ROS:  As per HPI.    HISTORY:  Past Medical History:  Past Medical History:  Diagnosis Date  . History of Hashimoto thyroiditis 2010  . Microscopic hematuria    negative work up 2019  . Migraines   . Thyroid disease    hypothyroid   Past Surgical History:  Past Surgical History:  Procedure Laterality Date  . excision of vaginal septum  2008  . WISDOM TOOTH EXTRACTION  2011    Social  History:  reports that she has never smoked. She has never used smokeless tobacco. She reports previous alcohol use. She reports that she does not use drugs. Family History:  Family History  Problem Relation Age of Onset  . Hyperlipidemia Mother   . Hashimoto's thyroiditis Maternal Grandmother   . Kidney cancer Maternal Grandmother   . Pulmonary embolism Maternal Grandmother   . Cancer Maternal Grandmother   . Diabetes Maternal Grandfather   . Heart disease Maternal Grandfather        CAD- living, ?hx of cabg  . Hypertension Maternal Grandfather   . Hypertension Paternal Grandmother   . Stroke Paternal Grandmother   . COPD Neg Hx      HOME MEDICATIONS: Allergies as of 05/08/2019      Reactions   Cephalosporins Hives   Penicillins Hives      Medication List       Accurate as of May 08, 2019  9:17 AM. If you have any questions, ask your nurse or doctor.        levothyroxine 50 MCG tablet Commonly known as: SYNTHROID Take 1 tablet (50 mcg total) by mouth as directed. 10 tablets weekly   PRENATAL VITAMINS PO Take by mouth.   SUMAtriptan 50 MG tablet Commonly known as: Imitrex May repeat in 2 hours if headache persists (max 2 tabs/24 hrs)  OBJECTIVE:   PHYSICAL EXAM: VS: BP 102/62 (BP Location: Left Arm, Patient Position: Sitting, Cuff Size: Normal)   Pulse (!) 102   Temp 98.1 F (36.7 C)   Ht 5\' 6"  (1.676 m)   Wt 135 lb 3.2 oz (61.3 kg)   LMP 01/08/2019 (Exact Date)   SpO2 98%   BMI 21.82 kg/m    EXAM: General: Pt appears well and is in NAD  Neck: General: Supple without adenopathy. Thyroid: Thyroid size normal.  No goiter or nodules appreciated. No thyroid bruit.  Lungs: Clear with good BS bilat with no rales, rhonchi, or wheezes  Heart: Auscultation: RRR.  Abdomen: Normoactive bowel sounds, soft, nontender, without masses or organomegaly palpable  Extremities:  BL LE: No pretibial edema normal ROM and strength.  Neuro: Cranial nerves: II -  XII grossly intact  Motor: Normal strength throughout DTRs: 2+ and symmetric in UE without delay in relaxation phase  Mental Status: Judgment, insight: Intact Orientation: Oriented to time, place, and person Mood and affect: No depression, anxiety, or agitation     DATA REVIEWED:  Results for Jenny, Eaton (MRN [id number]) as of 05/09/2019 08:46  Ref. Range 05/08/2019 09:32  TSH Latest Ref Range: 0.35 - 4.50 uIU/mL 1.44  T4,Free(Direct) Latest Ref Range: 0.60 - 1.60 ng/dL 0.69    ASSESSMENT / PLAN / RECOMMENDATIONS:   1. Hypothyroidism:  - Pt is in 2nd  trimester at 17.1 weeks - She is compliant with LT-4 replacement.  - Pt educated extensively on the correct way to take levothyroxine (first thing in the morning with water, 30 minutes before eating or taking other medications). - Pt encouraged to double dose the following day if she were to miss a dose given long half-life of levothyroxine. - TSH is at goal for second trimester  Pregnancy trimester specific TSH goals are as follows: First trimeter 0.1 - 2.5 uIU/mL, second trimester 0.2 - 3 uIU/mL, and third trimester 0.3 - 3 uIU/mL.   Medications   Continue Levothyroxine 50 mcg 10 tablets weekly  F/u in 11 weeks     Signed electronically by: Mack Guise, MD  [facility]  [address] [place], [place] [address] Phone: [phone] FAX: [phone]      CC: Debbrah Alar, [address] [place] [address] Phone: [phone]  Fax: [phone]   Return to Endocrinology clinic as below: Future Appointments  Date Time Provider [place]  05/21/2019  8:45 AM WH-MFC [place] 2 WH-MFCUS MFC-US  05/31/2019  8:15 AM Constant, Vickii Chafe, MD [facility] None

## 2019-05-08 NOTE — Patient Instructions (Signed)

## 2019-05-09 ENCOUNTER — Encounter: Payer: Self-pay | Admitting: Internal Medicine

## 2019-05-21 ENCOUNTER — Other Ambulatory Visit: Payer: Self-pay

## 2019-05-21 ENCOUNTER — Ambulatory Visit (HOSPITAL_COMMUNITY)
Admission: RE | Admit: 2019-05-21 | Discharge: 2019-05-21 | Disposition: A | Discharge status: Home / Self Care | Payer: BC Managed Care – PPO | Source: Ambulatory Visit | Attending: Obstetrics and Gynecology | Admitting: Obstetrics and Gynecology

## 2019-05-21 ENCOUNTER — Other Ambulatory Visit (HOSPITAL_COMMUNITY): Payer: Self-pay | Admitting: *Deleted

## 2019-05-21 DIAGNOSIS — K581 Irritable bowel syndrome with constipation: Secondary | ICD-10-CM | POA: Diagnosis not present

## 2019-05-21 DIAGNOSIS — Z3400 Encounter for supervision of normal first pregnancy, unspecified trimester: Secondary | ICD-10-CM | POA: Diagnosis not present

## 2019-05-21 DIAGNOSIS — Z363 Encounter for antenatal screening for malformations: Secondary | ICD-10-CM

## 2019-05-21 DIAGNOSIS — Z3A19 19 weeks gestation of pregnancy: Secondary | ICD-10-CM | POA: Diagnosis not present

## 2019-05-21 DIAGNOSIS — E039 Hypothyroidism, unspecified: Secondary | ICD-10-CM | POA: Insufficient documentation

## 2019-05-21 DIAGNOSIS — O99282 Endocrine, nutritional and metabolic diseases complicating pregnancy, second trimester: Secondary | ICD-10-CM

## 2019-05-21 DIAGNOSIS — Z362 Encounter for other antenatal screening follow-up: Secondary | ICD-10-CM

## 2019-05-31 ENCOUNTER — Encounter: Payer: Self-pay | Admitting: Obstetrics and Gynecology

## 2019-05-31 ENCOUNTER — Ambulatory Visit (INDEPENDENT_AMBULATORY_CARE_PROVIDER_SITE_OTHER): Payer: BC Managed Care – PPO | Admitting: Obstetrics and Gynecology

## 2019-05-31 ENCOUNTER — Other Ambulatory Visit: Payer: Self-pay

## 2019-05-31 VITALS — BP 109/67 | HR 82 | Wt 138.1 lb

## 2019-05-31 DIAGNOSIS — Z3402 Encounter for supervision of normal first pregnancy, second trimester: Secondary | ICD-10-CM

## 2019-05-31 DIAGNOSIS — Z3400 Encounter for supervision of normal first pregnancy, unspecified trimester: Secondary | ICD-10-CM

## 2019-05-31 DIAGNOSIS — E039 Hypothyroidism, unspecified: Secondary | ICD-10-CM

## 2019-05-31 DIAGNOSIS — Z3A20 20 weeks gestation of pregnancy: Secondary | ICD-10-CM

## 2019-05-31 NOTE — Progress Notes (Signed)
   PRENATAL VISIT NOTE  Subjective:  Jenny Eaton is a 28 y.o. G1P0000 at [redacted]w[redacted]d being seen today for ongoing prenatal care.  She is currently monitored for the following issues for this low-risk pregnancy and has Hypothyroidism; Migraines; Irritable bowel syndrome (IBS); Preventative health care; Abnormal Pap smear of cervix; Elevated blood pressure reading; Microscopic hematuria; and Supervision of normal first pregnancy, antepartum on their problem list.  Patient reports no complaints.  Contractions: Not present. Vag. Bleeding: None.  Movement: Present. Denies leaking of fluid.   The following portions of the patient's history were reviewed and updated as appropriate: allergies, current medications, past family history, past medical history, past social history, past surgical history and problem list.   Objective:   Vitals:   05/31/19 0817  BP: 109/67  Pulse: 82  Weight: 138 lb 1.3 oz (62.6 kg)    Fetal Status: Fetal Heart Rate (bpm): 148 Fundal Height: 20 cm Movement: Present     General:  Alert, oriented and cooperative. Patient is in no acute distress.  Skin: Skin is warm and dry. No rash noted.   Cardiovascular: Normal heart rate noted  Respiratory: Normal respiratory effort, no problems with respiration noted  Abdomen: Soft, gravid, appropriate for gestational age.  Pain/Pressure: Absent     Pelvic: Cervical exam deferred        Extremities: Normal range of motion.  Edema: None  Mental Status: Normal mood and affect. Normal behavior. Normal judgment and thought content.   Assessment and Plan:  Pregnancy: G1P0000 at [redacted]w[redacted]d 1. Supervision of normal first pregnancy, antepartum Patient is doing well without complaints Follow up ultrasound 8/21 AFP today - AFP, Serum, Open Spina Bifida  2. Hypothyroidism, unspecified type Stable Seen by endocrinologist mid July, lab was collected. No change in medication dosage Patient scheduled to follow up early September  Preterm  labor symptoms and general obstetric precautions including but not limited to vaginal bleeding, contractions, leaking of fluid and fetal movement were reviewed in detail with the patient. Please refer to After Visit Summary for other counseling recommendations.   Return in about 8 weeks (around 07/26/2019) for In person, ROB, 2 hr glucola next visit.  Future Appointments  Date Time Provider [place]  06/18/2019  8:30 AM WH-MFC [place] 1 WH-MFCUS MFC-US  07/24/2019  7:30 AM Shamleffer, Melanie Crazier, MD [facility] None    Mora Bellman, MD

## 2019-06-02 LAB — AFP, SERUM, OPEN SPINA BIFIDA
AFP MoM: 0.82
AFP Value: 50.1 ng/mL
Gest. Age on Collection Date: 20.3 weeks
Maternal Age At EDD: 29.7 yr
OSBR Risk 1 IN: 10000
Test Results:: NEGATIVE
Weight: 138 [lb_av]

## 2019-06-18 ENCOUNTER — Ambulatory Visit (HOSPITAL_COMMUNITY): Payer: BC Managed Care – PPO

## 2019-06-18 ENCOUNTER — Ambulatory Visit (HOSPITAL_COMMUNITY)
Admission: RE | Admit: 2019-06-18 | Discharge: 2019-06-18 | Disposition: A | Discharge status: Home / Self Care | Payer: BC Managed Care – PPO | Source: Ambulatory Visit | Attending: Obstetrics and Gynecology | Admitting: Obstetrics and Gynecology

## 2019-06-18 ENCOUNTER — Other Ambulatory Visit: Payer: Self-pay

## 2019-06-18 DIAGNOSIS — E039 Hypothyroidism, unspecified: Secondary | ICD-10-CM

## 2019-06-18 DIAGNOSIS — O283 Abnormal ultrasonic finding on antenatal screening of mother: Secondary | ICD-10-CM | POA: Insufficient documentation

## 2019-06-18 DIAGNOSIS — Z362 Encounter for other antenatal screening follow-up: Secondary | ICD-10-CM

## 2019-06-18 DIAGNOSIS — Z3A23 23 weeks gestation of pregnancy: Secondary | ICD-10-CM | POA: Diagnosis not present

## 2019-06-18 DIAGNOSIS — O99282 Endocrine, nutritional and metabolic diseases complicating pregnancy, second trimester: Secondary | ICD-10-CM

## 2019-06-24 LAB — MATERNIT 21 PLUS CORE, BLOOD
Fetal Fraction: 15
Result (T21): NEGATIVE
Trisomy 13 (Patau syndrome): NEGATIVE
Trisomy 18 (Edwards syndrome): NEGATIVE
Trisomy 21 (Down syndrome): NEGATIVE

## 2019-06-26 ENCOUNTER — Other Ambulatory Visit (HOSPITAL_COMMUNITY): Payer: Self-pay | Admitting: Obstetrics and Gynecology

## 2019-06-26 ENCOUNTER — Telehealth (HOSPITAL_COMMUNITY): Payer: Self-pay | Admitting: Genetic Counselor

## 2019-06-26 NOTE — Telephone Encounter (Signed)
I called Jenny Eaton to discuss her negative noninvasive prenatal screening (NIPS)/cell free DNA (cfDNA) testing result. Specifically, Jenny Eaton  had MaterniT21 testing through [facility]. These negative results demonstrated an expected representation of chromosome 21, 62, and 13 material, greatly reducing the likelihood of trisomies 68, 22, or 18 for the pregnancy. Results confirmed the expected fetal sex, which is female.  NIPS analyzes placental (fetal) DNA in maternal circulation. NIPS is considered to be highly specific and sensitive, but is not considered to be a diagnostic test. We reviewed that this testing identifies the majority of pregnancies with trisomies 1, 55, and 8, as well as the sex of the baby. Diagnostic testing via amniocentesis is available should she be interested in confirming this result. She confirmed that she had no questions about these results at this time.  Buelah Manis, MS Genetic Counselor

## 2019-07-24 ENCOUNTER — Other Ambulatory Visit: Payer: Self-pay

## 2019-07-24 ENCOUNTER — Encounter: Payer: Self-pay | Admitting: Internal Medicine

## 2019-07-24 ENCOUNTER — Ambulatory Visit (INDEPENDENT_AMBULATORY_CARE_PROVIDER_SITE_OTHER): Payer: BC Managed Care – PPO | Admitting: Internal Medicine

## 2019-07-24 VITALS — BP 104/64 | HR 83 | Temp 98.8°F | Ht 66.0 in | Wt 150.2 lb

## 2019-07-24 DIAGNOSIS — E039 Hypothyroidism, unspecified: Secondary | ICD-10-CM

## 2019-07-24 LAB — T4, FREE: Free T4: 0.8 ng/dL (ref 0.60–1.60)

## 2019-07-24 LAB — TSH: TSH: 1.57 u[IU]/mL (ref 0.35–4.50)

## 2019-07-24 NOTE — Progress Notes (Signed)
Name: Jenny Eaton  MRN/ DOB: [id number], 05-01-1991    Age/ Sex: 28 y.o., female     PCP: Debbrah Alar, NP   Reason for Endocrinology Evaluation: Hypothyroidism     Initial Endocrinology Clinic Visit: 01/03/2019    PATIENT IDENTIFIER: Jenny Eaton is a 28 y.o., female with a past medical history of migraine headaches and hypothyroidism. She has followed with [facility] since 01/03/2019 for consultative assistance with management of her hypothyroidism.   HISTORICAL SUMMARY: The patient was first diagnosed with hypothyroidism in 2006, she was in 8th grade at the time with hair loss, fatigue and dry eyes.   She has been on levothyroxine 50 mcg daily for a long time , she was also on OCP's but stopped them in October,2019 and started prenatals and that's when her TFT's became abnormal with a TSH of 0.34 uIU/mL. She was advised by her PCP to reduce the dose to 25 mcg with repeat TST of 6.54 uIU/mL.    SUBJECTIVE:   During last visit (05/08/2019): TSH was 1.44 uIU/mL. She was maintained on 50 mcg tabs at 10 tabs weekly   Today (07/24/2019):  [name] is here for a 3 month  follow up appointment on her hypothyroidism. She is currently at 28.1 weeks of gestation.   EDD 10/15/2019 She is currently on 10 tablets a week of levothyroxine   Denies nausea and vomiting   Denies abdominal pain Denies spotting .  Still has occasional    She is compliant with LT-4 replacement , she takes it appropriately. She takes her prenatals at night.  She has been using a pill box.   A girl Dafney   ROS:  As per HPI.    HISTORY:  Past Medical History:  Past Medical History:  Diagnosis Date  . History of Hashimoto thyroiditis 2010  . Microscopic hematuria    negative work up 2019  . Migraines   . Thyroid disease    hypothyroid   Past Surgical History:  Past Surgical History:  Procedure Laterality Date  . excision of vaginal septum  2008  . WISDOM TOOTH  EXTRACTION  2011    Social History:  reports that she has never smoked. She has never used smokeless tobacco. She reports previous alcohol use. She reports that she does not use drugs. Family History:  Family History  Problem Relation Age of Onset  . Hyperlipidemia Mother   . Hashimoto's thyroiditis Maternal Grandmother   . Kidney cancer Maternal Grandmother   . Pulmonary embolism Maternal Grandmother   . Cancer Maternal Grandmother   . Diabetes Maternal Grandfather   . Heart disease Maternal Grandfather        CAD- living, ?hx of cabg  . Hypertension Maternal Grandfather   . Hypertension Paternal Grandmother   . Stroke Paternal Grandmother   . COPD Neg Hx      HOME MEDICATIONS: Allergies as of 07/24/2019      Reactions   Cephalosporins Hives   Penicillins Hives      Medication List       Accurate as of July 24, 2019  7:17 AM. If you have any questions, ask your nurse or doctor.        levothyroxine 50 MCG tablet Commonly known as: SYNTHROID Take 1 tablet (50 mcg total) by mouth as directed. 10 tablets weekly   PRENATAL VITAMINS PO Take by mouth.   SUMAtriptan 50 MG tablet Commonly known as: Imitrex May repeat in 2 hours if headache persists (  max 2 tabs/24 hrs)         OBJECTIVE:   PHYSICAL EXAM: VS: LMP 01/08/2019 (Exact Date)    EXAM: General: Pt appears well and is in NAD  Neck: General: Supple without adenopathy. Thyroid: Thyroid size normal.  No goiter or nodules appreciated. No thyroid bruit.  Lungs: Clear with good BS bilat with no rales, rhonchi, or wheezes  Heart: Auscultation: RRR.  Abdomen: Normoactive bowel sounds, soft, nontender, without masses or organomegaly palpable  Extremities:  BL LE: No pretibial edema normal ROM and strength.  Neuro: Cranial nerves: II - XII grossly intact  Motor: Normal strength throughout DTRs: 2+ and symmetric in UE without delay in relaxation phase  Mental Status: Judgment, insight: Intact Orientation:  Oriented to time, place, and person Mood and affect: No depression, anxiety, or agitation     DATA REVIEWED: Results for MELONY, Eaton (MRN [id number]) as of 07/24/2019 12:53  Ref. Range 05/08/2019 09:32 07/24/2019 07:42  TSH Latest Ref Range: 0.35 - 4.50 uIU/mL 1.44 1.57  T4,Free(Direct) Latest Ref Range: 0.60 - 1.60 ng/dL 0.69 0.80     ASSESSMENT / PLAN / RECOMMENDATIONS:   1. Hypothyroidism During Third Trimester:  - Pt is in 3nd  trimester at 28.1 weeks - She is compliant with LT-4 replacement.  - Pt educated extensively on the correct way to take levothyroxine (first thing in the morning with water, 30 minutes before eating or taking other medications). - Pt encouraged to double dose the following day if she were to miss a dose given long half-life of levothyroxine. - TSH is at goal - Pt advised to return to pre-pregnancy dosing after delivery which was 50 mcg daily and to be followed by [place] 6 weeks post-partum   Pregnancy trimester specific TSH goals are as follows: First trimeter 0.1 - 2.5 uIU/mL, second trimester 0.2 - 3 uIU/mL, and third trimester 0.3 - 3 uIU/mL.   Medications   Continue Levothyroxine 50 mcg 10 tablets weekly    F/U in ~3 months   Signed electronically by: Mack Guise, MD  [facility]  [address] [place],  [address] Phone: [phone] FAX: [phone]      CC: Debbrah Alar, [address] [place] [address] Phone: [phone]  Fax: [phone]   Return to Endocrinology clinic as below: Future Appointments  Date Time Provider [place]  07/24/2019  7:30 AM Simon Llamas, Melanie Crazier, MD [facility] None  07/26/2019  8:30 AM Julianne Handler, CNM [facility] None

## 2019-07-24 NOTE — Patient Instructions (Signed)
You are on levothyroxine - which is your thyroid hormone supplement. You MUST take this consistently.  You should take this first thing in the morning on an empty stomach with water. You should not take it with other medications. Wait 98min to 1hr prior to eating. If you are taking any vitamins - please take these in the evening.   If you miss a dose, please take your missed dose the following day (double the dose for that day). You should have a pill box for ONLY levothyroxine on your bedside table to help you remember to take your medications.    - As soon as you deliver please go back to taking Levothyroxine 50 mcg daily - Will need to see you at 6 weeks after delivery for another thyroid check.

## 2019-07-26 ENCOUNTER — Ambulatory Visit (INDEPENDENT_AMBULATORY_CARE_PROVIDER_SITE_OTHER): Payer: BC Managed Care – PPO | Admitting: Certified Nurse Midwife

## 2019-07-26 ENCOUNTER — Other Ambulatory Visit: Payer: Self-pay

## 2019-07-26 ENCOUNTER — Encounter: Payer: Self-pay | Admitting: Certified Nurse Midwife

## 2019-07-26 VITALS — BP 99/66 | HR 106 | Wt 149.1 lb

## 2019-07-26 DIAGNOSIS — Z3400 Encounter for supervision of normal first pregnancy, unspecified trimester: Secondary | ICD-10-CM | POA: Diagnosis not present

## 2019-07-26 DIAGNOSIS — Z23 Encounter for immunization: Secondary | ICD-10-CM

## 2019-07-26 DIAGNOSIS — Z3A28 28 weeks gestation of pregnancy: Secondary | ICD-10-CM

## 2019-07-26 DIAGNOSIS — E039 Hypothyroidism, unspecified: Secondary | ICD-10-CM | POA: Diagnosis not present

## 2019-07-26 DIAGNOSIS — O99283 Endocrine, nutritional and metabolic diseases complicating pregnancy, third trimester: Secondary | ICD-10-CM

## 2019-07-26 NOTE — Progress Notes (Signed)
Subjective:  Jenny Eaton is a 28 y.o. G1P0000 at [redacted]w[redacted]d being seen today for ongoing prenatal care.  She is currently monitored for the following issues for this low-risk pregnancy and has Hypothyroidism; Migraines; Irritable bowel syndrome (IBS); Preventative health care; Abnormal Pap smear of cervix; Elevated blood pressure reading; Microscopic hematuria; Supervision of normal first pregnancy, antepartum; and Hypothyroidism affecting pregnancy on their problem list.  Patient reports no complaints.  Contractions: Not present. Vag. Bleeding: None.  Movement: Present. Denies leaking of fluid.   The following portions of the patient's history were reviewed and updated as appropriate: allergies, current medications, past family history, past medical history, past social history, past surgical history and problem list. Problem list updated.  Objective:   Vitals:   07/26/19 0831  BP: 99/66  Pulse: (!) 106  Weight: 149 lb 1.3 oz (67.6 kg)    Fetal Status: Fetal Heart Rate (bpm): 140 Fundal Height: 28 cm Movement: Present     General:  Alert, oriented and cooperative. Patient is in no acute distress.  Skin: Skin is warm and dry. No rash noted.   Cardiovascular: Normal heart rate noted  Respiratory: Normal respiratory effort, no problems with respiration noted  Abdomen: Soft, gravid, appropriate for gestational age. Pain/Pressure: Absent     Pelvic: Vag. Bleeding: None     Cervical exam deferred        Extremities: Normal range of motion.  Edema: None  Mental Status: Normal mood and affect. Normal behavior. Normal judgment and thought content.   Urinalysis:      Assessment and Plan:  Pregnancy: G1P0000 at [redacted]w[redacted]d  1. Supervision of normal first pregnancy, antepartum - CBC - Glucose Tolerance, 2 Hours w/1 Hour - HIV Antibody (routine testing w rflx) - RPR - Tdap vaccine greater than or equal to 7yo IM - Flu Vaccine QUAD 36+ mos IM  2. Hypothyroidism affecting pregnancy in third  trimester - saw endocrine on 10/6 - normal range TSH - continue Synthroid 50 mcg m-th, 100 mcg fri-sun  Preterm labor symptoms and general obstetric precautions including but not limited to vaginal bleeding, contractions, leaking of fluid and fetal movement were reviewed in detail with the patient. Please refer to After Visit Summary for other counseling recommendations.  Return in about 4 weeks (around 08/23/2019).   Julianne Handler, CNM

## 2019-07-26 NOTE — Patient Instructions (Signed)
Third Trimester of Pregnancy The third trimester is from week 28 through week 40 (months 7 through 9). The third trimester is a time when the unborn baby (fetus) is growing rapidly. At the end of the ninth month, the fetus is about 20 inches in length and weighs 6-10 pounds. Body changes during your third trimester Your body will continue to go through many changes during pregnancy. The changes vary from woman to woman. During the third trimester:  Your weight will continue to increase. You can expect to gain 25-35 pounds (11-16 kg) by the end of the pregnancy.  You may begin to get stretch marks on your hips, abdomen, and breasts.  You may urinate more often because the fetus is moving lower into your pelvis and pressing on your bladder.  You may develop or continue to have heartburn. This is caused by increased hormones that slow down muscles in the digestive tract.  You may develop or continue to have constipation because increased hormones slow digestion and cause the muscles that push waste through your intestines to relax.  You may develop hemorrhoids. These are swollen veins (varicose veins) in the rectum that can itch or be painful.  You may develop swollen, bulging veins (varicose veins) in your legs.  You may have increased body aches in the pelvis, back, or thighs. This is due to weight gain and increased hormones that are relaxing your joints.  You may have changes in your hair. These can include thickening of your hair, rapid growth, and changes in texture. Some women also have hair loss during or after pregnancy, or hair that feels dry or thin. Your hair will most likely return to normal after your baby is born.  Your breasts will continue to grow and they will continue to become tender. A yellow fluid (colostrum) may leak from your breasts. This is the first milk you are producing for your baby.  Your belly button may stick out.  You may notice more swelling in your hands,  face, or ankles.  You may have increased tingling or numbness in your hands, arms, and legs. The skin on your belly may also feel numb.  You may feel short of breath because of your expanding uterus.  You may have more problems sleeping. This can be caused by the size of your belly, increased need to urinate, and an increase in your body's metabolism.  You may notice the fetus "dropping," or moving lower in your abdomen (lightening).  You may have increased vaginal discharge.  You may notice your joints feel loose and you may have pain around your pelvic bone. What to expect at prenatal visits You will have prenatal exams every 2 weeks until week 36. Then you will have weekly prenatal exams. During a routine prenatal visit:  You will be weighed to make sure you and the baby are growing normally.  Your blood pressure will be taken.  Your abdomen will be measured to track your baby's growth.  The fetal heartbeat will be listened to.  Any test results from the previous visit will be discussed.  You may have a cervical check near your due date to see if your cervix has softened or thinned (effaced).  You will be tested for Group B streptococcus. This happens between 35 and 37 weeks. Your health care provider may ask you:  What your birth plan is.  How you are feeling.  If you are feeling the baby move.  If you have had any abnormal   symptoms, such as leaking fluid, bleeding, severe headaches, or abdominal cramping.  If you are using any tobacco products, including cigarettes, chewing tobacco, and electronic cigarettes.  If you have any questions. Other tests or screenings that may be performed during your third trimester include:  Blood tests that check for low iron levels (anemia).  Fetal testing to check the health, activity level, and growth of the fetus. Testing is done if you have certain medical conditions or if there are problems during the pregnancy.  Nonstress test  (NST). This test checks the health of your baby to make sure there are no signs of problems, such as the baby not getting enough oxygen. During this test, a belt is placed around your belly. The baby is made to move, and its heart rate is monitored during movement. What is false labor? False labor is a condition in which you feel small, irregular tightenings of the muscles in the womb (contractions) that usually go away with rest, changing position, or drinking water. These are called Braxton Hicks contractions. Contractions may last for hours, days, or even weeks before true labor sets in. If contractions come at regular intervals, become more frequent, increase in intensity, or become painful, you should see your health care provider. What are the signs of labor?  Abdominal cramps.  Regular contractions that start at 10 minutes apart and become stronger and more frequent with time.  Contractions that start on the top of the uterus and spread down to the lower abdomen and back.  Increased pelvic pressure and dull back pain.  A watery or bloody mucus discharge that comes from the vagina.  Leaking of amniotic fluid. This is also known as your "water breaking." It could be a slow trickle or a gush. Let your health care provider know if it has a color or strange odor. If you have any of these signs, call your health care provider right away, even if it is before your due date. Follow these instructions at home: Medicines  Follow your health care provider's instructions regarding medicine use. Specific medicines may be either safe or unsafe to take during pregnancy.  Take a prenatal vitamin that contains at least 600 micrograms (mcg) of folic acid.  If you develop constipation, try taking a stool softener if your health care provider approves. Eating and drinking   Eat a balanced diet that includes fresh fruits and vegetables, whole grains, good sources of protein such as meat, eggs, or tofu,  and low-fat dairy. Your health care provider will help you determine the amount of weight gain that is right for you.  Avoid raw meat and uncooked cheese. These carry germs that can cause birth defects in the baby.  If you have low calcium intake from food, talk to your health care provider about whether you should take a daily calcium supplement.  Eat four or five small meals rather than three large meals a day.  Limit foods that are high in fat and processed sugars, such as fried and sweet foods.  To prevent constipation: ? Drink enough fluid to keep your urine clear or pale yellow. ? Eat foods that are high in fiber, such as fresh fruits and vegetables, whole grains, and beans. Activity  Exercise only as directed by your health care provider. Most women can continue their usual exercise routine during pregnancy. Try to exercise for 30 minutes at least 5 days a week. Stop exercising if you experience uterine contractions.  Avoid heavy lifting.  Do   not exercise in extreme heat or humidity, or at high altitudes.  Wear low-heel, comfortable shoes.  Practice good posture.  You may continue to have sex unless your health care provider tells you otherwise. Relieving pain and discomfort  Take frequent breaks and rest with your legs elevated if you have leg cramps or low back pain.  Take warm sitz baths to soothe any pain or discomfort caused by hemorrhoids. Use hemorrhoid cream if your health care provider approves.  Wear a good support bra to prevent discomfort from breast tenderness.  If you develop varicose veins: ? Wear support pantyhose or compression stockings as told by your healthcare provider. ? Elevate your feet for 15 minutes, 3-4 times a day. Prenatal care  Write down your questions. Take them to your prenatal visits.  Keep all your prenatal visits as told by your health care provider. This is important. Safety  Wear your seat belt at all times when driving.  Make  a list of emergency phone numbers, including numbers for family, friends, the hospital, and police and fire departments. General instructions  Avoid cat litter boxes and soil used by cats. These carry germs that can cause birth defects in the baby. If you have a cat, ask someone to clean the litter box for you.  Do not travel far distances unless it is absolutely necessary and only with the approval of your health care provider.  Do not use hot tubs, steam rooms, or saunas.  Do not drink alcohol.  Do not use any products that contain nicotine or tobacco, such as cigarettes and e-cigarettes. If you need help quitting, ask your health care provider.  Do not use any medicinal herbs or unprescribed drugs. These chemicals affect the formation and growth of the baby.  Do not douche or use tampons or scented sanitary pads.  Do not cross your legs for long periods of time.  To prepare for the arrival of your baby: ? Take prenatal classes to understand, practice, and ask questions about labor and delivery. ? Make a trial run to the hospital. ? Visit the hospital and tour the maternity area. ? Arrange for maternity or paternity leave through employers. ? Arrange for family and friends to take care of pets while you are in the hospital. ? Purchase a rear-facing car seat and make sure you know how to install it in your car. ? Pack your hospital bag. ? Prepare the baby's nursery. Make sure to remove all pillows and stuffed animals from the baby's crib to prevent suffocation.  Visit your dentist if you have not gone during your pregnancy. Use a soft toothbrush to brush your teeth and be gentle when you floss. Contact a health care provider if:  You are unsure if you are in labor or if your water has broken.  You become dizzy.  You have mild pelvic cramps, pelvic pressure, or nagging pain in your abdominal area.  You have lower back pain.  You have persistent nausea, vomiting, or diarrhea.   You have an unusual or bad smelling vaginal discharge.  You have pain when you urinate. Get help right away if:  Your water breaks before 37 weeks.  You have regular contractions less than 5 minutes apart before 37 weeks.  You have a fever.  You are leaking fluid from your vagina.  You have spotting or bleeding from your vagina.  You have severe abdominal pain or cramping.  You have rapid weight loss or weight gain.  You have  shortness of breath with chest pain.  You notice sudden or extreme swelling of your face, hands, ankles, feet, or legs.  Your baby makes fewer than 10 movements in 2 hours.  You have severe headaches that do not go away when you take medicine.  You have vision changes. Summary  The third trimester is from week 28 through week 40, months 7 through 9. The third trimester is a time when the unborn baby (fetus) is growing rapidly.  During the third trimester, your discomfort may increase as you and your baby continue to gain weight. You may have abdominal, leg, and back pain, sleeping problems, and an increased need to urinate.  During the third trimester your breasts will keep growing and they will continue to become tender. A yellow fluid (colostrum) may leak from your breasts. This is the first milk you are producing for your baby.  False labor is a condition in which you feel small, irregular tightenings of the muscles in the womb (contractions) that eventually go away. These are called Braxton Hicks contractions. Contractions may last for hours, days, or even weeks before true labor sets in.  Signs of labor can include: abdominal cramps; regular contractions that start at 10 minutes apart and become stronger and more frequent with time; watery or bloody mucus discharge that comes from the vagina; increased pelvic pressure and dull back pain; and leaking of amniotic fluid. This information is not intended to replace advice given to you by your health  care provider. Make sure you discuss any questions you have with your health care provider. Document Released: 09/28/2001 Document Revised: 01/25/2019 Document Reviewed: 11/09/2016 Elsevier Patient Education  2020 [employer]  

## 2019-07-27 LAB — GLUCOSE TOLERANCE, 2 HOURS W/ 1HR
Glucose, 1 hour: 90 mg/dL (ref 65–179)
Glucose, 2 hour: 98 mg/dL (ref 65–152)
Glucose, Fasting: 68 mg/dL (ref 65–91)

## 2019-07-27 LAB — CBC
Hematocrit: 34.9 % (ref 34.0–46.6)
Hemoglobin: 11.9 g/dL (ref 11.1–15.9)
MCH: 32 pg (ref 26.6–33.0)
MCHC: 34.1 g/dL (ref 31.5–35.7)
MCV: 94 fL (ref 79–97)
Platelets: 179 10*3/uL (ref 150–450)
RBC: 3.72 x10E6/uL — ABNORMAL LOW (ref 3.77–5.28)
RDW: 11.6 % — ABNORMAL LOW (ref 11.7–15.4)
WBC: 6.7 10*3/uL (ref 3.4–10.8)

## 2019-07-27 LAB — HIV ANTIBODY (ROUTINE TESTING W REFLEX): HIV Screen 4th Generation wRfx: NONREACTIVE

## 2019-07-27 LAB — RPR: RPR Ser Ql: NONREACTIVE

## 2019-08-21 ENCOUNTER — Other Ambulatory Visit: Payer: Self-pay

## 2019-08-21 ENCOUNTER — Ambulatory Visit (INDEPENDENT_AMBULATORY_CARE_PROVIDER_SITE_OTHER): Payer: BC Managed Care – PPO | Admitting: Advanced Practice Midwife

## 2019-08-21 ENCOUNTER — Encounter: Payer: Self-pay | Admitting: Advanced Practice Midwife

## 2019-08-21 VITALS — BP 116/76 | HR 107 | Wt 152.0 lb

## 2019-08-21 DIAGNOSIS — O99283 Endocrine, nutritional and metabolic diseases complicating pregnancy, third trimester: Secondary | ICD-10-CM

## 2019-08-21 DIAGNOSIS — Z3400 Encounter for supervision of normal first pregnancy, unspecified trimester: Secondary | ICD-10-CM

## 2019-08-21 DIAGNOSIS — Z3A32 32 weeks gestation of pregnancy: Secondary | ICD-10-CM

## 2019-08-21 DIAGNOSIS — E039 Hypothyroidism, unspecified: Secondary | ICD-10-CM

## 2019-08-21 NOTE — Patient Instructions (Signed)
Third Trimester of Pregnancy The third trimester is from week 28 through week 40 (months 7 through 9). The third trimester is a time when the unborn baby (fetus) is growing rapidly. At the end of the ninth month, the fetus is about 20 inches in length and weighs 6-10 pounds. Body changes during your third trimester Your body will continue to go through many changes during pregnancy. The changes vary from woman to woman. During the third trimester:  Your weight will continue to increase. You can expect to gain 25-35 pounds (11-16 kg) by the end of the pregnancy.  You may begin to get stretch marks on your hips, abdomen, and breasts.  You may urinate more often because the fetus is moving lower into your pelvis and pressing on your bladder.  You may develop or continue to have heartburn. This is caused by increased hormones that slow down muscles in the digestive tract.  You may develop or continue to have constipation because increased hormones slow digestion and cause the muscles that push waste through your intestines to relax.  You may develop hemorrhoids. These are swollen veins (varicose veins) in the rectum that can itch or be painful.  You may develop swollen, bulging veins (varicose veins) in your legs.  You may have increased body aches in the pelvis, back, or thighs. This is due to weight gain and increased hormones that are relaxing your joints.  You may have changes in your hair. These can include thickening of your hair, rapid growth, and changes in texture. Some women also have hair loss during or after pregnancy, or hair that feels dry or thin. Your hair will most likely return to normal after your baby is born.  Your breasts will continue to grow and they will continue to become tender. A yellow fluid (colostrum) may leak from your breasts. This is the first milk you are producing for your baby.  Your belly button may stick out.  You may notice more swelling in your hands,  face, or ankles.  You may have increased tingling or numbness in your hands, arms, and legs. The skin on your belly may also feel numb.  You may feel short of breath because of your expanding uterus.  You may have more problems sleeping. This can be caused by the size of your belly, increased need to urinate, and an increase in your body's metabolism.  You may notice the fetus "dropping," or moving lower in your abdomen (lightening).  You may have increased vaginal discharge.  You may notice your joints feel loose and you may have pain around your pelvic bone. What to expect at prenatal visits You will have prenatal exams every 2 weeks until week 36. Then you will have weekly prenatal exams. During a routine prenatal visit:  You will be weighed to make sure you and the baby are growing normally.  Your blood pressure will be taken.  Your abdomen will be measured to track your baby's growth.  The fetal heartbeat will be listened to.  Any test results from the previous visit will be discussed.  You may have a cervical check near your due date to see if your cervix has softened or thinned (effaced).  You will be tested for Group B streptococcus. This happens between 35 and 37 weeks. Your health care provider may ask you:  What your birth plan is.  How you are feeling.  If you are feeling the baby move.  If you have had any abnormal   symptoms, such as leaking fluid, bleeding, severe headaches, or abdominal cramping.  If you are using any tobacco products, including cigarettes, chewing tobacco, and electronic cigarettes.  If you have any questions. Other tests or screenings that may be performed during your third trimester include:  Blood tests that check for low iron levels (anemia).  Fetal testing to check the health, activity level, and growth of the fetus. Testing is done if you have certain medical conditions or if there are problems during the pregnancy.  Nonstress test  (NST). This test checks the health of your baby to make sure there are no signs of problems, such as the baby not getting enough oxygen. During this test, a belt is placed around your belly. The baby is made to move, and its heart rate is monitored during movement. What is false labor? False labor is a condition in which you feel small, irregular tightenings of the muscles in the womb (contractions) that usually go away with rest, changing position, or drinking water. These are called Braxton Hicks contractions. Contractions may last for hours, days, or even weeks before true labor sets in. If contractions come at regular intervals, become more frequent, increase in intensity, or become painful, you should see your health care provider. What are the signs of labor?  Abdominal cramps.  Regular contractions that start at 10 minutes apart and become stronger and more frequent with time.  Contractions that start on the top of the uterus and spread down to the lower abdomen and back.  Increased pelvic pressure and dull back pain.  A watery or bloody mucus discharge that comes from the vagina.  Leaking of amniotic fluid. This is also known as your "water breaking." It could be a slow trickle or a gush. Let your health care provider know if it has a color or strange odor. If you have any of these signs, call your health care provider right away, even if it is before your due date. Follow these instructions at home: Medicines  Follow your health care provider's instructions regarding medicine use. Specific medicines may be either safe or unsafe to take during pregnancy.  Take a prenatal vitamin that contains at least 600 micrograms (mcg) of folic acid.  If you develop constipation, try taking a stool softener if your health care provider approves. Eating and drinking   Eat a balanced diet that includes fresh fruits and vegetables, whole grains, good sources of protein such as meat, eggs, or tofu,  and low-fat dairy. Your health care provider will help you determine the amount of weight gain that is right for you.  Avoid raw meat and uncooked cheese. These carry germs that can cause birth defects in the baby.  If you have low calcium intake from food, talk to your health care provider about whether you should take a daily calcium supplement.  Eat four or five small meals rather than three large meals a day.  Limit foods that are high in fat and processed sugars, such as fried and sweet foods.  To prevent constipation: ? Drink enough fluid to keep your urine clear or pale yellow. ? Eat foods that are high in fiber, such as fresh fruits and vegetables, whole grains, and beans. Activity  Exercise only as directed by your health care provider. Most women can continue their usual exercise routine during pregnancy. Try to exercise for 30 minutes at least 5 days a week. Stop exercising if you experience uterine contractions.  Avoid heavy lifting.  Do   not exercise in extreme heat or humidity, or at high altitudes.  Wear low-heel, comfortable shoes.  Practice good posture.  You may continue to have sex unless your health care provider tells you otherwise. Relieving pain and discomfort  Take frequent breaks and rest with your legs elevated if you have leg cramps or low back pain.  Take warm sitz baths to soothe any pain or discomfort caused by hemorrhoids. Use hemorrhoid cream if your health care provider approves.  Wear a good support bra to prevent discomfort from breast tenderness.  If you develop varicose veins: ? Wear support pantyhose or compression stockings as told by your healthcare provider. ? Elevate your feet for 15 minutes, 3-4 times a day. Prenatal care  Write down your questions. Take them to your prenatal visits.  Keep all your prenatal visits as told by your health care provider. This is important. Safety  Wear your seat belt at all times when driving.  Make  a list of emergency phone numbers, including numbers for family, friends, the hospital, and police and fire departments. General instructions  Avoid cat litter boxes and soil used by cats. These carry germs that can cause birth defects in the baby. If you have a cat, ask someone to clean the litter box for you.  Do not travel far distances unless it is absolutely necessary and only with the approval of your health care provider.  Do not use hot tubs, steam rooms, or saunas.  Do not drink alcohol.  Do not use any products that contain nicotine or tobacco, such as cigarettes and e-cigarettes. If you need help quitting, ask your health care provider.  Do not use any medicinal herbs or unprescribed drugs. These chemicals affect the formation and growth of the baby.  Do not douche or use tampons or scented sanitary pads.  Do not cross your legs for long periods of time.  To prepare for the arrival of your baby: ? Take prenatal classes to understand, practice, and ask questions about labor and delivery. ? Make a trial run to the hospital. ? Visit the hospital and tour the maternity area. ? Arrange for maternity or paternity leave through employers. ? Arrange for family and friends to take care of pets while you are in the hospital. ? Purchase a rear-facing car seat and make sure you know how to install it in your car. ? Pack your hospital bag. ? Prepare the baby's nursery. Make sure to remove all pillows and stuffed animals from the baby's crib to prevent suffocation.  Visit your dentist if you have not gone during your pregnancy. Use a soft toothbrush to brush your teeth and be gentle when you floss. Contact a health care provider if:  You are unsure if you are in labor or if your water has broken.  You become dizzy.  You have mild pelvic cramps, pelvic pressure, or nagging pain in your abdominal area.  You have lower back pain.  You have persistent nausea, vomiting, or diarrhea.   You have an unusual or bad smelling vaginal discharge.  You have pain when you urinate. Get help right away if:  Your water breaks before 37 weeks.  You have regular contractions less than 5 minutes apart before 37 weeks.  You have a fever.  You are leaking fluid from your vagina.  You have spotting or bleeding from your vagina.  You have severe abdominal pain or cramping.  You have rapid weight loss or weight gain.  You have  shortness of breath with chest pain.  You notice sudden or extreme swelling of your face, hands, ankles, feet, or legs.  Your baby makes fewer than 10 movements in 2 hours.  You have severe headaches that do not go away when you take medicine.  You have vision changes. Summary  The third trimester is from week 28 through week 40, months 7 through 9. The third trimester is a time when the unborn baby (fetus) is growing rapidly.  During the third trimester, your discomfort may increase as you and your baby continue to gain weight. You may have abdominal, leg, and back pain, sleeping problems, and an increased need to urinate.  During the third trimester your breasts will keep growing and they will continue to become tender. A yellow fluid (colostrum) may leak from your breasts. This is the first milk you are producing for your baby.  False labor is a condition in which you feel small, irregular tightenings of the muscles in the womb (contractions) that eventually go away. These are called Braxton Hicks contractions. Contractions may last for hours, days, or even weeks before true labor sets in.  Signs of labor can include: abdominal cramps; regular contractions that start at 10 minutes apart and become stronger and more frequent with time; watery or bloody mucus discharge that comes from the vagina; increased pelvic pressure and dull back pain; and leaking of amniotic fluid. This information is not intended to replace advice given to you by your health  care provider. Make sure you discuss any questions you have with your health care provider. Document Released: 09/28/2001 Document Revised: 01/25/2019 Document Reviewed: 11/09/2016 Elsevier Patient Education  2020 [employer]  

## 2019-08-21 NOTE — Progress Notes (Signed)
   PRENATAL VISIT NOTE  Subjective:  Jenny Eaton is a 28 y.o. G1P0000 at [redacted]w[redacted]d being seen today for ongoing prenatal care.  She is currently monitored for the following issues for this high-risk pregnancy and has Hypothyroidism; Migraines; Irritable bowel syndrome (IBS); Preventative health care; Abnormal Pap smear of cervix; Elevated blood pressure reading; Microscopic hematuria; Supervision of normal first pregnancy, antepartum; and Hypothyroidism affecting pregnancy on their problem list.  Patient reports no complaints and endocrinologist states her levels are good and medication adjusted.  Contractions: Not present. Vag. Bleeding: None.  Movement: Present. Denies leaking of fluid.   The following portions of the patient's history were reviewed and updated as appropriate: allergies, current medications, past family history, past medical history, past social history, past surgical history and problem list.   Objective:   Vitals:   08/21/19 0936  BP: 116/76  Pulse: (!) 107  Weight: 68.9 kg    Fetal Status: Fetal Heart Rate (bpm): 145   Movement: Present     General:  Alert, oriented and cooperative. Patient is in no acute distress.  Skin: Skin is warm and dry. No rash noted.   Cardiovascular: Normal heart rate noted  Respiratory: Normal respiratory effort, no problems with respiration noted  Abdomen: Soft, gravid, appropriate for gestational age.  Pain/Pressure: Absent     Pelvic: Cervical exam deferred        Extremities: Normal range of motion.  Edema: None  Mental Status: Normal mood and affect. Normal behavior. Normal judgment and thought content.   Assessment and Plan:  Pregnancy: G1P0000 at [redacted]w[redacted]d 1. Supervision of normal first pregnancy, antepartum Good FM Reviewed signs to report and how to use MAU Follow Hypothyroidism with endocrinologist  Preterm labor symptoms and general obstetric precautions including but not limited to vaginal bleeding, contractions, leaking  of fluid and fetal movement were reviewed in detail with the patient. Please refer to After Visit Summary for other counseling recommendations.   Return in about 4 weeks (around 09/18/2019) for [employer].  Future Appointments  Date Time Provider [place]  09/18/2019  8:15 AM Nugent, Gerrie Nordmann, NP [facility] None    Hansel Feinstein, CNM

## 2019-09-07 ENCOUNTER — Other Ambulatory Visit: Payer: Self-pay

## 2019-09-07 ENCOUNTER — Ambulatory Visit (INDEPENDENT_AMBULATORY_CARE_PROVIDER_SITE_OTHER): Payer: BC Managed Care – PPO | Admitting: Obstetrics & Gynecology

## 2019-09-07 VITALS — BP 114/71 | HR 102 | Wt 154.0 lb

## 2019-09-07 DIAGNOSIS — Z3A34 34 weeks gestation of pregnancy: Secondary | ICD-10-CM

## 2019-09-07 DIAGNOSIS — E039 Hypothyroidism, unspecified: Secondary | ICD-10-CM

## 2019-09-07 DIAGNOSIS — E038 Other specified hypothyroidism: Secondary | ICD-10-CM

## 2019-09-07 DIAGNOSIS — Z3400 Encounter for supervision of normal first pregnancy, unspecified trimester: Secondary | ICD-10-CM

## 2019-09-07 DIAGNOSIS — O99283 Endocrine, nutritional and metabolic diseases complicating pregnancy, third trimester: Secondary | ICD-10-CM

## 2019-09-07 NOTE — Progress Notes (Signed)
   PRENATAL VISIT NOTE  Subjective:  Jenny Eaton is a 28 y.o. G1P0000 at [redacted]w[redacted]d being seen today for ongoing prenatal care.  She is currently monitored for the following issues for this low-risk pregnancy and has Hypothyroidism; Migraines; Irritable bowel syndrome (IBS); Preventative health care; Abnormal Pap smear of cervix; Elevated blood pressure reading; Microscopic hematuria; Supervision of normal first pregnancy, antepartum; and Hypothyroidism affecting pregnancy on their problem list.  Patient reports pain in RUQ over rib cage. Sometimes feels like burning or pulling. Not assoc with eating. .  Contractions: Not present. Vag. Bleeding: None.  Movement: Present. Denies leaking of fluid.   The following portions of the patient's history were reviewed and updated as appropriate: allergies, current medications, past family history, past medical history, past social history, past surgical history and problem list.   Objective:   Vitals:   09/07/19 1023  BP: 114/71  Pulse: (!) 102  Weight: 154 lb (69.9 kg)    Fetal Status: Fetal Heart Rate (bpm): 141   Movement: Present     General:  Alert, oriented and cooperative. Patient is in no acute distress.  Skin: Skin is warm and dry. No rash noted.   Cardiovascular: Normal heart rate noted  Respiratory: Normal respiratory effort, no problems with respiration noted  Abdomen: Soft, gravid, appropriate for gestational age.  Pain/Pressure: Present     Pelvic: Cervical exam deferred        Extremities: Normal range of motion.  Edema: None  Mental Status: Normal mood and affect. Normal behavior. Normal judgment and thought content.   Assessment and Plan:  Pregnancy: G1P0000 at [redacted]w[redacted]d 1. Supervision of normal first pregnancy, antepartum FH and FHR wnl MSK pain over rib cage on right side- heating pad ok.   2. Hypothyroidism affecting pregnancy, antepartum  3. Other specified hypothyroidism   Preterm labor symptoms and general obstetric  precautions including but not limited to vaginal bleeding, contractions, leaking of fluid and fetal movement were reviewed in detail with the patient. Please refer to After Visit Summary for other counseling recommendations.   No follow-ups on file.  Future Appointments  Date Time Provider [place]  09/18/2019  8:15 AM Nugent, Gerrie Nordmann, NP [facility] None    Lavonia Drafts, MD

## 2019-09-07 NOTE — Patient Instructions (Signed)
Third Trimester of Pregnancy The third trimester is from week 28 through week 40 (months 7 through 9). The third trimester is a time when the unborn baby (fetus) is growing rapidly. At the end of the ninth month, the fetus is about 20 inches in length and weighs 6-10 pounds. Body changes during your third trimester Your body will continue to go through many changes during pregnancy. The changes vary from woman to woman. During the third trimester:  Your weight will continue to increase. You can expect to gain 25-35 pounds (11-16 kg) by the end of the pregnancy.  You may begin to get stretch marks on your hips, abdomen, and breasts.  You may urinate more often because the fetus is moving lower into your pelvis and pressing on your bladder.  You may develop or continue to have heartburn. This is caused by increased hormones that slow down muscles in the digestive tract.  You may develop or continue to have constipation because increased hormones slow digestion and cause the muscles that push waste through your intestines to relax.  You may develop hemorrhoids. These are swollen veins (varicose veins) in the rectum that can itch or be painful.  You may develop swollen, bulging veins (varicose veins) in your legs.  You may have increased body aches in the pelvis, back, or thighs. This is due to weight gain and increased hormones that are relaxing your joints.  You may have changes in your hair. These can include thickening of your hair, rapid growth, and changes in texture. Some women also have hair loss during or after pregnancy, or hair that feels dry or thin. Your hair will most likely return to normal after your baby is born.  Your breasts will continue to grow and they will continue to become tender. A yellow fluid (colostrum) may leak from your breasts. This is the first milk you are producing for your baby.  Your belly button may stick out.  You may notice more swelling in your hands,  face, or ankles.  You may have increased tingling or numbness in your hands, arms, and legs. The skin on your belly may also feel numb.  You may feel short of breath because of your expanding uterus.  You may have more problems sleeping. This can be caused by the size of your belly, increased need to urinate, and an increase in your body's metabolism.  You may notice the fetus "dropping," or moving lower in your abdomen (lightening).  You may have increased vaginal discharge.  You may notice your joints feel loose and you may have pain around your pelvic bone. What to expect at prenatal visits You will have prenatal exams every 2 weeks until week 36. Then you will have weekly prenatal exams. During a routine prenatal visit:  You will be weighed to make sure you and the baby are growing normally.  Your blood pressure will be taken.  Your abdomen will be measured to track your baby's growth.  The fetal heartbeat will be listened to.  Any test results from the previous visit will be discussed.  You may have a cervical check near your due date to see if your cervix has softened or thinned (effaced).  You will be tested for Group B streptococcus. This happens between 35 and 37 weeks. Your health care provider may ask you:  What your birth plan is.  How you are feeling.  If you are feeling the baby move.  If you have had any abnormal   symptoms, such as leaking fluid, bleeding, severe headaches, or abdominal cramping.  If you are using any tobacco products, including cigarettes, chewing tobacco, and electronic cigarettes.  If you have any questions. Other tests or screenings that may be performed during your third trimester include:  Blood tests that check for low iron levels (anemia).  Fetal testing to check the health, activity level, and growth of the fetus. Testing is done if you have certain medical conditions or if there are problems during the pregnancy.  Nonstress test  (NST). This test checks the health of your baby to make sure there are no signs of problems, such as the baby not getting enough oxygen. During this test, a belt is placed around your belly. The baby is made to move, and its heart rate is monitored during movement. What is false labor? False labor is a condition in which you feel small, irregular tightenings of the muscles in the womb (contractions) that usually go away with rest, changing position, or drinking water. These are called Braxton Hicks contractions. Contractions may last for hours, days, or even weeks before true labor sets in. If contractions come at regular intervals, become more frequent, increase in intensity, or become painful, you should see your health care provider. What are the signs of labor?  Abdominal cramps.  Regular contractions that start at 10 minutes apart and become stronger and more frequent with time.  Contractions that start on the top of the uterus and spread down to the lower abdomen and back.  Increased pelvic pressure and dull back pain.  A watery or bloody mucus discharge that comes from the vagina.  Leaking of amniotic fluid. This is also known as your "water breaking." It could be a slow trickle or a gush. Let your health care provider know if it has a color or strange odor. If you have any of these signs, call your health care provider right away, even if it is before your due date. Follow these instructions at home: Medicines  Follow your health care provider's instructions regarding medicine use. Specific medicines may be either safe or unsafe to take during pregnancy.  Take a prenatal vitamin that contains at least 600 micrograms (mcg) of folic acid.  If you develop constipation, try taking a stool softener if your health care provider approves. Eating and drinking   Eat a balanced diet that includes fresh fruits and vegetables, whole grains, good sources of protein such as meat, eggs, or tofu,  and low-fat dairy. Your health care provider will help you determine the amount of weight gain that is right for you.  Avoid raw meat and uncooked cheese. These carry germs that can cause birth defects in the baby.  If you have low calcium intake from food, talk to your health care provider about whether you should take a daily calcium supplement.  Eat four or five small meals rather than three large meals a day.  Limit foods that are high in fat and processed sugars, such as fried and sweet foods.  To prevent constipation: ? Drink enough fluid to keep your urine clear or pale yellow. ? Eat foods that are high in fiber, such as fresh fruits and vegetables, whole grains, and beans. Activity  Exercise only as directed by your health care provider. Most women can continue their usual exercise routine during pregnancy. Try to exercise for 30 minutes at least 5 days a week. Stop exercising if you experience uterine contractions.  Avoid heavy lifting.  Do   not exercise in extreme heat or humidity, or at high altitudes.  Wear low-heel, comfortable shoes.  Practice good posture.  You may continue to have sex unless your health care provider tells you otherwise. Relieving pain and discomfort  Take frequent breaks and rest with your legs elevated if you have leg cramps or low back pain.  Take warm sitz baths to soothe any pain or discomfort caused by hemorrhoids. Use hemorrhoid cream if your health care provider approves.  Wear a good support bra to prevent discomfort from breast tenderness.  If you develop varicose veins: ? Wear support pantyhose or compression stockings as told by your healthcare provider. ? Elevate your feet for 15 minutes, 3-4 times a day. Prenatal care  Write down your questions. Take them to your prenatal visits.  Keep all your prenatal visits as told by your health care provider. This is important. Safety  Wear your seat belt at all times when driving.  Make  a list of emergency phone numbers, including numbers for family, friends, the hospital, and police and fire departments. General instructions  Avoid cat litter boxes and soil used by cats. These carry germs that can cause birth defects in the baby. If you have a cat, ask someone to clean the litter box for you.  Do not travel far distances unless it is absolutely necessary and only with the approval of your health care provider.  Do not use hot tubs, steam rooms, or saunas.  Do not drink alcohol.  Do not use any products that contain nicotine or tobacco, such as cigarettes and e-cigarettes. If you need help quitting, ask your health care provider.  Do not use any medicinal herbs or unprescribed drugs. These chemicals affect the formation and growth of the baby.  Do not douche or use tampons or scented sanitary pads.  Do not cross your legs for long periods of time.  To prepare for the arrival of your baby: ? Take prenatal classes to understand, practice, and ask questions about labor and delivery. ? Make a trial run to the hospital. ? Visit the hospital and tour the maternity area. ? Arrange for maternity or paternity leave through employers. ? Arrange for family and friends to take care of pets while you are in the hospital. ? Purchase a rear-facing car seat and make sure you know how to install it in your car. ? Pack your hospital bag. ? Prepare the baby's nursery. Make sure to remove all pillows and stuffed animals from the baby's crib to prevent suffocation.  Visit your dentist if you have not gone during your pregnancy. Use a soft toothbrush to brush your teeth and be gentle when you floss. Contact a health care provider if:  You are unsure if you are in labor or if your water has broken.  You become dizzy.  You have mild pelvic cramps, pelvic pressure, or nagging pain in your abdominal area.  You have lower back pain.  You have persistent nausea, vomiting, or diarrhea.   You have an unusual or bad smelling vaginal discharge.  You have pain when you urinate. Get help right away if:  Your water breaks before 37 weeks.  You have regular contractions less than 5 minutes apart before 37 weeks.  You have a fever.  You are leaking fluid from your vagina.  You have spotting or bleeding from your vagina.  You have severe abdominal pain or cramping.  You have rapid weight loss or weight gain.  You have  shortness of breath with chest pain.  You notice sudden or extreme swelling of your face, hands, ankles, feet, or legs.  Your baby makes fewer than 10 movements in 2 hours.  You have severe headaches that do not go away when you take medicine.  You have vision changes. Summary  The third trimester is from week 28 through week 40, months 7 through 9. The third trimester is a time when the unborn baby (fetus) is growing rapidly.  During the third trimester, your discomfort may increase as you and your baby continue to gain weight. You may have abdominal, leg, and back pain, sleeping problems, and an increased need to urinate.  During the third trimester your breasts will keep growing and they will continue to become tender. A yellow fluid (colostrum) may leak from your breasts. This is the first milk you are producing for your baby.  False labor is a condition in which you feel small, irregular tightenings of the muscles in the womb (contractions) that eventually go away. These are called Braxton Hicks contractions. Contractions may last for hours, days, or even weeks before true labor sets in.  Signs of labor can include: abdominal cramps; regular contractions that start at 10 minutes apart and become stronger and more frequent with time; watery or bloody mucus discharge that comes from the vagina; increased pelvic pressure and dull back pain; and leaking of amniotic fluid. This information is not intended to replace advice given to you by your health  care provider. Make sure you discuss any questions you have with your health care provider. Document Released: 09/28/2001 Document Revised: 01/25/2019 Document Reviewed: 11/09/2016 Elsevier Patient Education  2020 [employer]  

## 2019-09-17 NOTE — Progress Notes (Signed)
Subjective:  Jenny Eaton is a 28 y.o. Jenny Eaton at [redacted]w[redacted]d being seen today for ongoing prenatal care.  She is currently monitored for the following issues for this high-risk pregnancy and has Hypothyroidism; Migraines; Irritable bowel syndrome (IBS); Abnormal Pap smear of cervix; Elevated blood pressure reading; Microscopic hematuria; Supervision of normal first pregnancy, antepartum; and Hypothyroidism affecting pregnancy on their problem list.  Patient reports no complaints.  Contractions: Not present. Vag. Bleeding: None.  Movement: Present. Denies leaking of fluid.   The following portions of the patient's history were reviewed and updated as appropriate: allergies, current medications, past family history, past medical history, past social history, past surgical history and problem list. Problem list updated.  Objective:   Vitals:   09/18/19 0814  BP: 111/65  Pulse: 92  Weight: 155 lb 1.3 oz (70.3 kg)    Fetal Status: Fetal Heart Rate (bpm): 142 Fundal Height: 36 cm Movement: Present  Presentation: Vertex  General:  Alert, oriented and cooperative. Patient is in no acute distress.  Skin: Skin is warm and dry. No rash noted.   Cardiovascular: Normal heart rate noted  Respiratory: Normal respiratory effort, no problems with respiration noted  Abdomen: Soft, gravid, appropriate for gestational age. Pain/Pressure: Present     Pelvic: Vag. Bleeding: None     Cervical exam deferred        Extremities: Normal range of motion.  Edema: None  Mental Status: Normal mood and affect. Normal behavior. Normal judgment and thought content.   Assessment and Plan:  Pregnancy: Jenny Eaton at [redacted]w[redacted]d  1. Supervision of normal first pregnancy, antepartum - Culture, beta strep (group b only) - GC/Chlamydia Probe Amp([facility])  2. Hypothyroidism affecting pregnancy in third trimester - pt reports she has an endocrinologist that reports she is "good to go" until delivery and does not require additional  blood work to monitor thyroid medication dosage; as a result, labs not performed today  3. Elevated blood pressure reading - BP today 111/65 - pt has BP cuff at home, states BP is better in pregnancy - discussed s/sx of preeclampsia, information given  4. Abnormal cervical Papanicolaou smear, unspecified abnormal pap finding - dx from 01/2016, Pap 03/2019 [facility]  Term labor symptoms and general obstetric precautions including but not limited to vaginal bleeding, contractions, leaking of fluid and fetal movement were reviewed in detail with the patient. Please refer to After Visit Summary for other counseling recommendations.  Return in about 2 weeks (around 10/02/2019) for in-person ROB.   Sallee Hogrefe, Gerrie Nordmann, NP

## 2019-09-18 ENCOUNTER — Encounter: Payer: Self-pay | Admitting: Women's Health

## 2019-09-18 ENCOUNTER — Ambulatory Visit (INDEPENDENT_AMBULATORY_CARE_PROVIDER_SITE_OTHER): Payer: BC Managed Care – PPO | Admitting: Women's Health

## 2019-09-18 ENCOUNTER — Other Ambulatory Visit: Payer: Self-pay

## 2019-09-18 VITALS — BP 111/65 | HR 92 | Wt 155.1 lb

## 2019-09-18 DIAGNOSIS — Z113 Encounter for screening for infections with a predominantly sexual mode of transmission: Secondary | ICD-10-CM | POA: Diagnosis not present

## 2019-09-18 DIAGNOSIS — R87619 Unspecified abnormal cytological findings in specimens from cervix uteri: Secondary | ICD-10-CM

## 2019-09-18 DIAGNOSIS — R030 Elevated blood-pressure reading, without diagnosis of hypertension: Secondary | ICD-10-CM

## 2019-09-18 DIAGNOSIS — Z3A36 36 weeks gestation of pregnancy: Secondary | ICD-10-CM

## 2019-09-18 DIAGNOSIS — E039 Hypothyroidism, unspecified: Secondary | ICD-10-CM

## 2019-09-18 DIAGNOSIS — O99283 Endocrine, nutritional and metabolic diseases complicating pregnancy, third trimester: Secondary | ICD-10-CM

## 2019-09-18 DIAGNOSIS — Z3400 Encounter for supervision of normal first pregnancy, unspecified trimester: Secondary | ICD-10-CM

## 2019-09-18 LAB — OB RESULTS CONSOLE GBS: GBS: NEGATIVE

## 2019-09-18 NOTE — Addendum Note (Signed)
Addended by: Phill Myron on: 09/18/2019 08:55 AM   Modules accepted: Orders

## 2019-09-18 NOTE — Patient Instructions (Signed)
Preeclampsia and Eclampsia Preeclampsia is a serious condition that may develop during pregnancy. This condition causes high blood pressure and increased protein in your urine along with other symptoms, such as headaches and vision changes. These symptoms may develop as the condition gets worse. Preeclampsia may occur at 20 weeks of pregnancy or later. Diagnosing and treating preeclampsia early is very important. If not treated early, it can cause serious problems for you and your baby. One problem it can lead to is eclampsia. Eclampsia is a condition that causes muscle jerking or shaking (convulsions or seizures) and other serious problems for the mother. During pregnancy, delivering your baby may be the best treatment for preeclampsia or eclampsia. For most women, preeclampsia and eclampsia symptoms go away after giving birth. In rare cases, a woman may develop preeclampsia after giving birth (postpartum preeclampsia). This usually occurs within 48 hours after childbirth but may occur up to 6 weeks after giving birth. What are the causes? The cause of preeclampsia is not known. What increases the risk? The following risk factors make you more likely to develop preeclampsia:  Being pregnant for the first time.  Having had preeclampsia during a past pregnancy.  Having a family history of preeclampsia.  Having high blood pressure.  Being pregnant with more than one baby.  Being 35 or older.  Being African-American.  Having kidney disease or diabetes.  Having medical conditions such as lupus or blood diseases.  Being very overweight (obese). What are the signs or symptoms? The most common symptoms are:  Severe headaches.  Vision problems, such as blurred or double vision.  Abdominal pain, especially upper abdominal pain. Other symptoms that may develop as the condition gets worse include:  Sudden weight gain.  Sudden swelling of the hands, face, legs, and feet.  Severe nausea  and vomiting.  Numbness in the face, arms, legs, and feet.  Dizziness.  Urinating less than usual.  Slurred speech.  Convulsions or seizures. How is this diagnosed? There are no screening tests for preeclampsia. Your health care provider will ask you about symptoms and check for signs of preeclampsia during your prenatal visits. You may also have tests that include:  Checking your blood pressure.  Urine tests to check for protein. Your health care provider will check for this at every prenatal visit.  Blood tests.  Monitoring your baby's heart rate.  Ultrasound. How is this treated? You and your health care provider will determine the treatment approach that is best for you. Treatment may include:  Having more frequent prenatal exams to check for signs of preeclampsia, if you have an increased risk for preeclampsia.  Medicine to lower your blood pressure.  Staying in the hospital, if your condition is severe. There, treatment will focus on controlling your blood pressure and the amount of fluids in your body (fluid retention).  Taking medicine (magnesium sulfate) to prevent seizures. This may be given as an injection or through an IV.  Taking a low-dose aspirin during your pregnancy.  Delivering your baby early. You may have your labor started with medicine (induced), or you may have a cesarean delivery. Follow these instructions at home: Eating and drinking   Drink enough fluid to keep your urine pale yellow.  Avoid caffeine. Lifestyle  Do not use any products that contain nicotine or tobacco, such as cigarettes and e-cigarettes. If you need help quitting, ask your health care provider.  Do not use alcohol or drugs.  Avoid stress as much as possible. Rest and get   plenty of sleep. General instructions  Take over-the-counter and prescription medicines only as told by your health care provider.  When lying down, lie on your left side. This keeps pressure off your  major blood vessels.  When sitting or lying down, raise (elevate) your feet. Try putting some pillows underneath your lower legs.  Exercise regularly. Ask your health care provider what kinds of exercise are best for you.  Keep all follow-up and prenatal visits as told by your health care provider. This is important. How is this prevented? There is no known way of preventing preeclampsia or eclampsia from developing. However, to lower your risk of complications and detect problems early:  Get regular prenatal care. Your health care provider may be able to diagnose and treat the condition early.  Maintain a healthy weight. Ask your health care provider for help managing weight gain during pregnancy.  Work with your health care provider to manage any long-term (chronic) health conditions you have, such as diabetes or kidney problems.  You may have tests of your blood pressure and kidney function after giving birth.  Your health care provider may have you take low-dose aspirin during your next pregnancy. Contact a health care provider if:  You have symptoms that your health care provider told you may require more treatment or monitoring, such as: ? Headaches. ? Nausea or vomiting. ? Abdominal pain. ? Dizziness. ? Light-headedness. Get help right away if:  You have severe: ? Abdominal pain. ? Headaches that do not get better. ? Dizziness. ? Vision problems. ? Confusion. ? Nausea or vomiting.  You have any of the following: ? A seizure. ? Sudden, rapid weight gain. ? Sudden swelling in your hands, ankles, or face. ? Trouble moving any part of your body. ? Numbness in any part of your body. ? Trouble speaking. ? Abnormal bleeding.  You faint. Summary  Preeclampsia is a serious condition that may develop during pregnancy.  This condition causes high blood pressure and increased protein in your urine along with other symptoms, such as headaches and vision changes.   Diagnosing and treating preeclampsia early is very important. If not treated early, it can cause serious problems for you and your baby.  Get help right away if you have symptoms that your health care provider told you to watch for. This information is not intended to replace advice given to you by your health care provider. Make sure you discuss any questions you have with your health care provider. Document Released: 10/01/2000 Document Revised: 06/06/2018 Document Reviewed: 05/10/2016 Elsevier Patient Education  2020 [employer]. Pregnancy and Hypothyroidism Hypothyroidism is a condition that develops if your thyroid has low activity. The thyroid is a small, butterfly-shaped gland in your neck. It is located in front of your windpipe. It makes hormones that play an important role in regulating your breathing, heart rate, menstrual cycle, body temperature, and other bodily functions. If you have hypothyroidism, your thyroid gland does not produce enough thyroid hormones. When you are pregnant, your body uses more thyroid hormones. This can cause mild hypothyroidism to get worse. How does this affect me? Hypothyroidism during pregnancy can cause you to have:  Fatigue.  Abnormal weight gain. For women of normal weight, it is common to gain about 1 pound per week during pregnancy.  Difficulty having a bowel movement (constipation).  Feeling cold more often than others do.  Muscle aches.  Pregnancy complications, such as: ? High blood pressure that develops after the 20th week of pregnancy (  preeclampsia). ? Pregnancy loss (miscarriage). ? Preterm birth. ? Placenta problems. How does this affect my baby? Hypothyroidism can also affect your baby. Babies need thyroid hormone from their mothers for normal growth and brain development. Babies born to mothers with hypothyroidism during pregnancy may:  Be born prematurely.  Have low birth weight.  Have mental delays.  May develop  hypothyroidism. This is rare. What can I do to lower my risk? Some women with hypothyroidism need extra iodine during pregnancy. Your health care provider may recommend that you:  Eat foods with iodine, such as: ? Iodized salt. ? Pasteurized eggs and dairy products. ? Low-mercury seafood.  Take a prenatal vitamin that contains iodine.  Take iodine supplements. How is this treated? Treatment may include:  Monitoring. If you have mild hypothyroidism, your health care provider will monitor your thyroid hormone levels closely to watch for any changes.  Medicines. Your health care provider may prescribe medicine to control your thyroid hormone levels. Follow these instructions at home:  Take over-the-counter and prescription medicines only as told by your health care provider. ? Check with your health care provider before taking any hypothyroid medicines that were prescribed before you became pregnant. Many are safe, but some treatments for hypothyroidism may have to be stopped during pregnancy.  You may be asked to perform kick counts to monitor your baby's movements. If your baby moves fewer than 10 times in 2 hours during a period when the baby is usually active (typically in the evening), you should see your health care provider right away.  Take a prenatal vitamin as told by your health care provider.  Keep all follow-up visits. This is important. Contact a health care provider if you:  Have new symptoms or your symptoms get worse.  Gain more than 5 lb (2.3 kg) in 1 week.  Have a lump in your neck.  Have a scratchy throat or difficulty speaking that lasts longer than a month and is not related to a cold.  Have a hard time swallowing. Get help right away if:  Your baby is less active than normal.  Your baby stops moving completely.  You develop muscle cramps.  You have pain in your abdomen.  You have heavy bleeding.  You develop a fever or chills.  You have a very  bad headache or vision problems.  You develop swelling in your legs and ankles. Summary  Hypothyroidism is a condition that develops if your thyroid has low activity.  Hypothyroidism during pregnancy can lead to complications for both you and your baby.  Take medicines, vitamins, and supplements as told by your health care provider during your pregnancy to control your condition.  Keep regular prenatal appointments so your health care provider can closely monitor your condition during pregnancy. This information is not intended to replace advice given to you by your health care provider. Make sure you discuss any questions you have with your health care provider. Document Released: 08/01/2007 Document Revised: 01/26/2019 Document Reviewed: 11/08/2017 Elsevier Patient Education  2020 [employer]. Signs and Symptoms of Labor Labor is your body's natural process of moving your baby, placenta, and umbilical cord out of your uterus. The process of labor usually starts when your baby is full-term, between 55 and 40 weeks of pregnancy. How will I know when I am close to going into labor? As your body prepares for labor and the birth of your baby, you may notice the following symptoms in the weeks and days before true labor  starts:  Having a strong desire to get your home ready to receive your new baby. This is called nesting. Nesting may be a sign that labor is approaching, and it may occur several weeks before birth. Nesting may involve cleaning and organizing your home.  Passing a small amount of thick, bloody mucus out of your vagina (normal bloody show or losing your mucus plug). This may happen more than a week before labor begins, or it might occur right before labor begins as the opening of the cervix starts to widen (dilate). For some women, the entire mucus plug passes at once. For others, smaller portions of the mucus plug may gradually pass over several days.  Your baby moving  (dropping) lower in your pelvis to get into position for birth (lightening). When this happens, you may feel more pressure on your bladder and pelvic bone and less pressure on your ribs. This may make it easier to breathe. It may also cause you to need to urinate more often and have problems with bowel movements.  Having "practice contractions" (Braxton Hicks contractions) that occur at irregular (unevenly spaced) intervals that are more than 10 minutes apart. This is also called false labor. False labor contractions are common after exercise or sexual activity, and they will stop if you change position, rest, or drink fluids. These contractions are usually mild and do not get stronger over time. They may feel like: ? A backache or back pain. ? Mild cramps, similar to menstrual cramps. ? Tightening or pressure in your abdomen. Other early symptoms that labor may be starting soon include:  Nausea or loss of appetite.  Diarrhea.  Having a sudden burst of energy, or feeling very tired.  Mood changes.  Having trouble sleeping. How will I know when labor has begun? Signs that true labor has begun may include:  Having contractions that come at regular (evenly spaced) intervals and increase in intensity. This may feel like more intense tightening or pressure in your abdomen that moves to your back. ? Contractions may also feel like rhythmic pain in your upper thighs or back that comes and goes at regular intervals. ? For first-time mothers, this change in intensity of contractions often occurs at a more gradual pace. ? Women who have given birth before may notice a more rapid progression of contraction changes.  Having a feeling of pressure in the vaginal area.  Your water breaking (rupture of membranes). This is when the sac of fluid that surrounds your baby breaks. When this happens, you will notice fluid leaking from your vagina. This may be clear or blood-tinged. Labor usually starts within  24 hours of your water breaking, but it may take longer to begin. ? Some women notice this as a gush of fluid. ? Others notice that their underwear repeatedly becomes damp. Follow these instructions at home:   When labor starts, or if your water breaks, call your health care provider or nurse care line. Based on your situation, they will determine when you should go in for an exam.  When you are in early labor, you may be able to rest and manage symptoms at home. Some strategies to try at home include: ? Breathing and relaxation techniques. ? Taking a warm bath or shower. ? Listening to music. ? Using a heating pad on the lower back for pain. If you are directed to use heat:  Place a towel between your skin and the heat source.  Leave the heat on for 20-30  minutes.  Remove the heat if your skin turns bright red. This is especially important if you are unable to feel pain, heat, or cold. You may have a greater risk of getting burned. Get help right away if:  You have painful, regular contractions that are 5 minutes apart or less.  Labor starts before you are [redacted] weeks along in your pregnancy.  You have a fever.  You have a headache that does not go away.  You have bright red blood coming from your vagina.  You do not feel your baby moving.  You have a sudden onset of: ? Severe headache with vision problems. ? Nausea, vomiting, or diarrhea. ? Chest pain or shortness of breath. These symptoms may be an emergency. If your health care provider recommends that you go to the hospital or birth center where you plan to deliver, do not drive yourself. Have someone else drive you, or call emergency services (911 in the [place] Summary  Labor is your body's natural process of moving your baby, placenta, and umbilical cord out of your uterus.  The process of labor usually starts when your baby is full-term, between 17 and 40 weeks of pregnancy.  When labor starts, or if your water breaks,  call your health care provider or nurse care line. Based on your situation, they will determine when you should go in for an exam. This information is not intended to replace advice given to you by your health care provider. Make sure you discuss any questions you have with your health care provider. Document Released: 03/11/2017 Document Revised: 07/04/2017 Document Reviewed: 03/11/2017 Elsevier Patient Education  [place] of Pregnancy The third trimester is from week 28 through week 40 (months 7 through 9). The third trimester is a time when the unborn baby (fetus) is growing rapidly. At the end of the ninth month, the fetus is about 20 inches in length and weighs 6-10 pounds. Body changes during your third trimester Your body will continue to go through many changes during pregnancy. The changes vary from woman to woman. During the third trimester:  Your weight will continue to increase. You can expect to gain 25-35 pounds (11-16 kg) by the end of the pregnancy.  You may begin to get stretch marks on your hips, abdomen, and breasts.  You may urinate more often because the fetus is moving lower into your pelvis and pressing on your bladder.  You may develop or continue to have heartburn. This is caused by increased hormones that slow down muscles in the digestive tract.  You may develop or continue to have constipation because increased hormones slow digestion and cause the muscles that push waste through your intestines to relax.  You may develop hemorrhoids. These are swollen veins (varicose veins) in the rectum that can itch or be painful.  You may develop swollen, bulging veins (varicose veins) in your legs.  You may have increased body aches in the pelvis, back, or thighs. This is due to weight gain and increased hormones that are relaxing your joints.  You may have changes in your hair. These can include thickening of your hair, rapid growth, and changes in  texture. Some women also have hair loss during or after pregnancy, or hair that feels dry or thin. Your hair will most likely return to normal after your baby is born.  Your breasts will continue to grow and they will continue to become tender. A yellow fluid (colostrum) may leak from your  breasts. This is the first milk you are producing for your baby.  Your belly button may stick out.  You may notice more swelling in your hands, face, or ankles.  You may have increased tingling or numbness in your hands, arms, and legs. The skin on your belly may also feel numb.  You may feel short of breath because of your expanding uterus.  You may have more problems sleeping. This can be caused by the size of your belly, increased need to urinate, and an increase in your body's metabolism.  You may notice the fetus "dropping," or moving lower in your abdomen (lightening).  You may have increased vaginal discharge.  You may notice your joints feel loose and you may have pain around your pelvic bone. What to expect at prenatal visits You will have prenatal exams every 2 weeks until week 36. Then you will have weekly prenatal exams. During a routine prenatal visit:  You will be weighed to make sure you and the baby are growing normally.  Your blood pressure will be taken.  Your abdomen will be measured to track your baby's growth.  The fetal heartbeat will be listened to.  Any test results from the previous visit will be discussed.  You may have a cervical check near your due date to see if your cervix has softened or thinned (effaced).  You will be tested for Group B streptococcus. This happens between 35 and 37 weeks. Your health care provider may ask you:  What your birth plan is.  How you are feeling.  If you are feeling the baby move.  If you have had any abnormal symptoms, such as leaking fluid, bleeding, severe headaches, or abdominal cramping.  If you are using any tobacco  products, including cigarettes, chewing tobacco, and electronic cigarettes.  If you have any questions. Other tests or screenings that may be performed during your third trimester include:  Blood tests that check for low iron levels (anemia).  Fetal testing to check the health, activity level, and growth of the fetus. Testing is done if you have certain medical conditions or if there are problems during the pregnancy.  Nonstress test (NST). This test checks the health of your baby to make sure there are no signs of problems, such as the baby not getting enough oxygen. During this test, a belt is placed around your belly. The baby is made to move, and its heart rate is monitored during movement. What is false labor? False labor is a condition in which you feel small, irregular tightenings of the muscles in the womb (contractions) that usually go away with rest, changing position, or drinking water. These are called Braxton Hicks contractions. Contractions may last for hours, days, or even weeks before true labor sets in. If contractions come at regular intervals, become more frequent, increase in intensity, or become painful, you should see your health care provider. What are the signs of labor?  Abdominal cramps.  Regular contractions that start at 10 minutes apart and become stronger and more frequent with time.  Contractions that start on the top of the uterus and spread down to the lower abdomen and back.  Increased pelvic pressure and dull back pain.  A watery or bloody mucus discharge that comes from the vagina.  Leaking of amniotic fluid. This is also known as your "water breaking." It could be a slow trickle or a gush. Let your health care provider know if it has a color or strange odor.  If you have any of these signs, call your health care provider right away, even if it is before your due date. Follow these instructions at home: Medicines  Follow your health care provider's  instructions regarding medicine use. Specific medicines may be either safe or unsafe to take during pregnancy.  Take a prenatal vitamin that contains at least 600 micrograms (mcg) of folic acid.  If you develop constipation, try taking a stool softener if your health care provider approves. Eating and drinking   Eat a balanced diet that includes fresh fruits and vegetables, whole grains, good sources of protein such as meat, eggs, or tofu, and low-fat dairy. Your health care provider will help you determine the amount of weight gain that is right for you.  Avoid raw meat and uncooked cheese. These carry germs that can cause birth defects in the baby.  If you have low calcium intake from food, talk to your health care provider about whether you should take a daily calcium supplement.  Eat four or five small meals rather than three large meals a day.  Limit foods that are high in fat and processed sugars, such as fried and sweet foods.  To prevent constipation: ? Drink enough fluid to keep your urine clear or pale yellow. ? Eat foods that are high in fiber, such as fresh fruits and vegetables, whole grains, and beans. Activity  Exercise only as directed by your health care provider. Most women can continue their usual exercise routine during pregnancy. Try to exercise for 30 minutes at least 5 days a week. Stop exercising if you experience uterine contractions.  Avoid heavy lifting.  Do not exercise in extreme heat or humidity, or at high altitudes.  Wear low-heel, comfortable shoes.  Practice good posture.  You may continue to have sex unless your health care provider tells you otherwise. Relieving pain and discomfort  Take frequent breaks and rest with your legs elevated if you have leg cramps or low back pain.  Take warm sitz baths to soothe any pain or discomfort caused by hemorrhoids. Use hemorrhoid cream if your health care provider approves.  Wear a good support bra to  prevent discomfort from breast tenderness.  If you develop varicose veins: ? Wear support pantyhose or compression stockings as told by your healthcare provider. ? Elevate your feet for 15 minutes, 3-4 times a day. Prenatal care  Write down your questions. Take them to your prenatal visits.  Keep all your prenatal visits as told by your health care provider. This is important. Safety  Wear your seat belt at all times when driving.  Make a list of emergency phone numbers, including numbers for family, friends, the hospital, and police and fire departments. General instructions  Avoid cat litter boxes and soil used by cats. These carry germs that can cause birth defects in the baby. If you have a cat, ask someone to clean the litter box for you.  Do not travel far distances unless it is absolutely necessary and only with the approval of your health care provider.  Do not use hot tubs, steam rooms, or saunas.  Do not drink alcohol.  Do not use any products that contain nicotine or tobacco, such as cigarettes and e-cigarettes. If you need help quitting, ask your health care provider.  Do not use any medicinal herbs or unprescribed drugs. These chemicals affect the formation and growth of the baby.  Do not douche or use tampons or scented sanitary pads.  Do not  cross your legs for long periods of time.  To prepare for the arrival of your baby: ? Take prenatal classes to understand, practice, and ask questions about labor and delivery. ? Make a trial run to the hospital. ? Visit the hospital and tour the maternity area. ? Arrange for maternity or paternity leave through employers. ? Arrange for family and friends to take care of pets while you are in the hospital. ? Purchase a rear-facing car seat and make sure you know how to install it in your car. ? Pack your hospital bag. ? Prepare the baby's nursery. Make sure to remove all pillows and stuffed animals from the baby's crib to  prevent suffocation.  Visit your dentist if you have not gone during your pregnancy. Use a soft toothbrush to brush your teeth and be gentle when you floss. Contact a health care provider if:  You are unsure if you are in labor or if your water has broken.  You become dizzy.  You have mild pelvic cramps, pelvic pressure, or nagging pain in your abdominal area.  You have lower back pain.  You have persistent nausea, vomiting, or diarrhea.  You have an unusual or bad smelling vaginal discharge.  You have pain when you urinate. Get help right away if:  Your water breaks before 37 weeks.  You have regular contractions less than 5 minutes apart before 37 weeks.  You have a fever.  You are leaking fluid from your vagina.  You have spotting or bleeding from your vagina.  You have severe abdominal pain or cramping.  You have rapid weight loss or weight gain.  You have shortness of breath with chest pain.  You notice sudden or extreme swelling of your face, hands, ankles, feet, or legs.  Your baby makes fewer than 10 movements in 2 hours.  You have severe headaches that do not go away when you take medicine.  You have vision changes. Summary  The third trimester is from week 28 through week 40, months 7 through 9. The third trimester is a time when the unborn baby (fetus) is growing rapidly.  During the third trimester, your discomfort may increase as you and your baby continue to gain weight. You may have abdominal, leg, and back pain, sleeping problems, and an increased need to urinate.  During the third trimester your breasts will keep growing and they will continue to become tender. A yellow fluid (colostrum) may leak from your breasts. This is the first milk you are producing for your baby.  False labor is a condition in which you feel small, irregular tightenings of the muscles in the womb (contractions) that eventually go away. These are called Braxton Hicks  contractions. Contractions may last for hours, days, or even weeks before true labor sets in.  Signs of labor can include: abdominal cramps; regular contractions that start at 10 minutes apart and become stronger and more frequent with time; watery or bloody mucus discharge that comes from the vagina; increased pelvic pressure and dull back pain; and leaking of amniotic fluid. This information is not intended to replace advice given to you by your health care provider. Make sure you discuss any questions you have with your health care provider. Document Released: 09/28/2001 Document Revised: 01/25/2019 Document Reviewed: 11/09/2016 Elsevier Patient Education  2020 [employer].

## 2019-09-19 LAB — GC/CHLAMYDIA PROBE AMP (~~LOC~~) NOT AT ARMC
Chlamydia: NEGATIVE
Comment: NEGATIVE
Comment: NORMAL
Neisseria Gonorrhea: NEGATIVE

## 2019-09-22 LAB — CULTURE, BETA STREP (GROUP B ONLY): Strep Gp B Culture: NEGATIVE

## 2019-10-02 ENCOUNTER — Encounter: Payer: Self-pay | Admitting: Advanced Practice Midwife

## 2019-10-02 ENCOUNTER — Other Ambulatory Visit: Payer: Self-pay

## 2019-10-02 ENCOUNTER — Ambulatory Visit (INDEPENDENT_AMBULATORY_CARE_PROVIDER_SITE_OTHER): Payer: BC Managed Care – PPO | Admitting: Advanced Practice Midwife

## 2019-10-02 VITALS — BP 117/79 | HR 82 | Wt 160.0 lb

## 2019-10-02 DIAGNOSIS — O99283 Endocrine, nutritional and metabolic diseases complicating pregnancy, third trimester: Secondary | ICD-10-CM

## 2019-10-02 DIAGNOSIS — Z3A38 38 weeks gestation of pregnancy: Secondary | ICD-10-CM

## 2019-10-02 DIAGNOSIS — E039 Hypothyroidism, unspecified: Secondary | ICD-10-CM

## 2019-10-02 DIAGNOSIS — Z3400 Encounter for supervision of normal first pregnancy, unspecified trimester: Secondary | ICD-10-CM

## 2019-10-02 NOTE — Progress Notes (Signed)
   PRENATAL VISIT NOTE  Subjective:  Jenny Eaton is a 28 y.o. G1P0000 at [redacted]w[redacted]d being seen today for ongoing prenatal care.  She is currently monitored for the following issues for this low-risk pregnancy and has Hypothyroidism; Migraines; Irritable bowel syndrome (IBS); Abnormal Pap smear of cervix; Elevated blood pressure reading; Microscopic hematuria; Supervision of normal first pregnancy, antepartum; and Hypothyroidism affecting pregnancy on their problem list.  Patient reports no complaints.  Contractions: Not present. Vag. Bleeding: None.  Movement: Present. Denies leaking of fluid.   The following portions of the patient's history were reviewed and updated as appropriate: allergies, current medications, past family history, past medical history, past social history, past surgical history and problem list.   Objective:   Vitals:   10/02/19 0814 10/02/19 0827  BP: (!) 113/91 117/79  Pulse: 82   Weight: 72.6 kg     Fetal Status: Fetal Heart Rate (bpm): 142   Movement: Present     General:  Alert, oriented and cooperative. Patient is in no acute distress.  Skin: Skin is warm and dry. No rash noted.   Cardiovascular: Normal heart rate noted  Respiratory: Normal respiratory effort, no problems with respiration noted  Abdomen: Soft, gravid, appropriate for gestational age.  Pain/Pressure: Present     Pelvic: Cervical exam performed      Cx Closed/70%/-1/Vtx  Extremities: Normal range of motion.  Edema: None  Mental Status: Normal mood and affect. Normal behavior. Normal judgment and thought content.   Assessment and Plan:  Pregnancy: G1P0000 at [redacted]w[redacted]d 1. Supervision of normal first pregnancy, antepartum     Labor precautions reviewed     IOL at 41wks     First BP spurious reading.  Second normal  BP precautions reviewed  2. Hypothyroidism affecting pregnancy in third trimester      Stable on med  Term labor symptoms and general obstetric precautions including but not  limited to vaginal bleeding, contractions, leaking of fluid and fetal movement were reviewed in detail with the patient. Please refer to After Visit Summary for other counseling recommendations.   Return in about 1 week (around 10/09/2019) for [facility].  No future appointments.  Hansel Feinstein, CNM

## 2019-10-02 NOTE — Patient Instructions (Signed)
Labor   Vaginal delivery means that you give birth by pushing your baby out of your birth canal (vagina). A team of health care providers will help you before, during, and after vaginal delivery. Birth experiences are unique for every woman and every pregnancy, and birth experiences vary depending on where you choose to give birth. What happens when I arrive at the birth center or hospital? Once you are in labor and have been admitted into the hospital or birth center, your health care provider may:  Review your pregnancy history and any concerns that you have.  Insert an IV into one of your veins. This may be used to give you fluids and medicines.  Check your blood pressure, pulse, temperature, and heart rate (vital signs).  Check whether your bag of water (amniotic sac) has broken (ruptured).  Talk with you about your birth plan and discuss pain control options. Monitoring Your health care provider may monitor your contractions (uterine monitoring) and your baby's heart rate (fetal monitoring). You may need to be monitored:  Often, but not continuously (intermittently).  All the time or for long periods at a time (continuously). Continuous monitoring may be needed if: ? You are taking certain medicines, such as medicine to relieve pain or make your contractions stronger. ? You have pregnancy or labor complications. Monitoring may be done by:  Placing a special stethoscope or a handheld monitoring device on your abdomen to check your baby's heartbeat and to check for contractions.  Placing monitors on your abdomen (external monitors) to record your baby's heartbeat and the frequency and length of contractions.  Placing monitors inside your uterus through your vagina (internal monitors) to record your baby's heartbeat and the frequency, length, and strength of your contractions. Depending on the type of monitor, it may remain in your uterus or on your baby's head until  birth.  Telemetry. This is a type of continuous monitoring that can be done with external or internal monitors. Instead of having to stay in bed, you are able to move around during telemetry. Physical exam Your health care provider may perform frequent physical exams. This may include:  Checking how and where your baby is positioned in your uterus.  Checking your cervix to determine: ? Whether it is thinning out (effacing). ? Whether it is opening up (dilating). What happens during labor and delivery?  Normal labor and delivery is divided into the following three stages: Stage 1  This is the longest stage of labor.  This stage can last for hours or days.  Throughout this stage, you will feel contractions. Contractions generally feel mild, infrequent, and irregular at first. They get stronger, more frequent (about every 2-3 minutes), and more regular as you move through this stage.  This stage ends when your cervix is completely dilated to 4 inches (10 cm) and completely effaced. Stage 2  This stage starts once your cervix is completely effaced and dilated and lasts until the delivery of your baby.  This stage may last from 20 minutes to 2 hours.  This is the stage where you will feel an urge to push your baby out of your vagina.  You may feel stretching and burning pain, especially when the widest part of your baby's head passes through the vaginal opening (crowning).  Once your baby is delivered, the umbilical cord will be clamped and cut. This usually occurs after waiting a period of 1-2 minutes after delivery.  Your baby will be placed on your bare chest (  skin-to-skin contact) in an upright position and covered with a warm blanket. Watch your baby for feeding cues, like rooting or sucking, and help the baby to your breast for his or her first feeding. Stage 3  This stage starts immediately after the birth of your baby and ends after you deliver the placenta.  This stage may  take anywhere from 5 to 30 minutes.  After your baby has been delivered, you will feel contractions as your body expels the placenta and your uterus contracts to control bleeding. What can I expect after labor and delivery?  After labor is over, you and your baby will be monitored closely until you are ready to go home to ensure that you are both healthy. Your health care team will teach you how to care for yourself and your baby.  You and your baby will stay in the same room (rooming in) during your hospital stay. This will encourage early bonding and successful breastfeeding.  You may continue to receive fluids and medicines through an IV.  Your uterus will be checked and massaged regularly (fundal massage).  You will have some soreness and pain in your abdomen, vagina, and the area of skin between your vaginal opening and your anus (perineum).  If an incision was made near your vagina (episiotomy) or if you had some vaginal tearing during delivery, cold compresses may be placed on your episiotomy or your tear. This helps to reduce pain and swelling.  You may be given a squirt bottle to use instead of wiping when you go to the bathroom. To use the squirt bottle, follow these steps: ? Before you urinate, fill the squirt bottle with warm water. Do not use hot water. ? After you urinate, while you are sitting on the toilet, use the squirt bottle to rinse the area around your urethra and vaginal opening. This rinses away any urine and blood. ? Fill the squirt bottle with clean water every time you use the bathroom.  It is normal to have vaginal bleeding after delivery. Wear a sanitary pad for vaginal bleeding and discharge. Summary  Vaginal delivery means that you will give birth by pushing your baby out of your birth canal (vagina).  Your health care provider may monitor your contractions (uterine monitoring) and your baby's heart rate (fetal monitoring).  Your health care provider may  perform a physical exam.  Normal labor and delivery is divided into three stages.  After labor is over, you and your baby will be monitored closely until you are ready to go home. This information is not intended to replace advice given to you by your health care provider. Make sure you discuss any questions you have with your health care provider. Document Released: 07/13/2008 Document Revised: 11/08/2017 Document Reviewed: 11/08/2017 Elsevier Patient Education  2020 [employer].

## 2019-10-09 ENCOUNTER — Encounter: Payer: Self-pay | Admitting: Advanced Practice Midwife

## 2019-10-09 ENCOUNTER — Ambulatory Visit (INDEPENDENT_AMBULATORY_CARE_PROVIDER_SITE_OTHER): Payer: BC Managed Care – PPO | Admitting: Advanced Practice Midwife

## 2019-10-09 ENCOUNTER — Other Ambulatory Visit: Payer: Self-pay

## 2019-10-09 DIAGNOSIS — Z3403 Encounter for supervision of normal first pregnancy, third trimester: Secondary | ICD-10-CM

## 2019-10-09 DIAGNOSIS — Z3400 Encounter for supervision of normal first pregnancy, unspecified trimester: Secondary | ICD-10-CM

## 2019-10-09 DIAGNOSIS — Z3A39 39 weeks gestation of pregnancy: Secondary | ICD-10-CM

## 2019-10-09 NOTE — Patient Instructions (Signed)

## 2019-10-09 NOTE — Progress Notes (Signed)
   PRENATAL VISIT NOTE  Subjective:  Jenny Eaton is a 28 y.o. G1P0000 at [redacted]w[redacted]d being seen today for ongoing prenatal care.  She is currently monitored for the following issues for this low-risk pregnancy and has Hypothyroidism; Migraines; Irritable bowel syndrome (IBS); Abnormal Pap smear of cervix; Elevated blood pressure reading; Microscopic hematuria; Supervision of normal first pregnancy, antepartum; and Hypothyroidism affecting pregnancy on their problem list.  Patient reports occasional contractions.  Contractions: Not present. Vag. Bleeding: None.  Movement: Present. Denies leaking of fluid.   The following portions of the patient's history were reviewed and updated as appropriate: allergies, current medications, past family history, past medical history, past social history, past surgical history and problem list.   Objective:   Vitals:   10/09/19 0813  BP: 123/84  Pulse: (!) 110  Weight: 72.6 kg    Fetal Status: Fetal Heart Rate (bpm): 135   Movement: Present     General:  Alert, oriented and cooperative. Patient is in no acute distress.  Skin: Skin is warm and dry. No rash noted.   Cardiovascular: Normal heart rate noted  Respiratory: Normal respiratory effort, no problems with respiration noted  Abdomen: Soft, gravid, appropriate for gestational age.  Pain/Pressure: Present     Pelvic: Cervical exam deferred        Extremities: Normal range of motion.  Edema: None  Mental Status: Normal mood and affect. Normal behavior. Normal judgment and thought content.   Assessment and Plan:  Pregnancy: G1P0000 at [redacted]w[redacted]d  Term labor symptoms and general obstetric precautions including but not limited to vaginal bleeding, contractions, leaking of fluid and fetal movement were reviewed in detail with the patient. Please refer to After Visit Summary for other counseling recommendations.   Return in about 1 week (around 10/16/2019).  Future Appointments  Date Time Provider  [place]  10/15/2019  8:15 AM Lavonia Drafts, MD [facility] None  10/22/2019  8:15 AM Lavonia Drafts, MD [facility] None    Hansel Feinstein, CNM

## 2019-10-15 ENCOUNTER — Encounter: Payer: Self-pay | Admitting: Obstetrics & Gynecology

## 2019-10-15 ENCOUNTER — Other Ambulatory Visit (HOSPITAL_COMMUNITY): Payer: Self-pay | Admitting: Advanced Practice Midwife

## 2019-10-15 ENCOUNTER — Other Ambulatory Visit: Payer: Self-pay

## 2019-10-15 ENCOUNTER — Ambulatory Visit (INDEPENDENT_AMBULATORY_CARE_PROVIDER_SITE_OTHER): Payer: BC Managed Care – PPO | Admitting: Obstetrics & Gynecology

## 2019-10-15 ENCOUNTER — Telehealth (HOSPITAL_COMMUNITY): Payer: Self-pay | Admitting: *Deleted

## 2019-10-15 VITALS — BP 107/80 | HR 83 | Wt 163.1 lb

## 2019-10-15 DIAGNOSIS — O99280 Endocrine, nutritional and metabolic diseases complicating pregnancy, unspecified trimester: Secondary | ICD-10-CM

## 2019-10-15 DIAGNOSIS — E039 Hypothyroidism, unspecified: Secondary | ICD-10-CM

## 2019-10-15 DIAGNOSIS — Z3A40 40 weeks gestation of pregnancy: Secondary | ICD-10-CM | POA: Diagnosis not present

## 2019-10-15 DIAGNOSIS — Z3400 Encounter for supervision of normal first pregnancy, unspecified trimester: Secondary | ICD-10-CM

## 2019-10-15 NOTE — Telephone Encounter (Signed)
Preadmission screen  

## 2019-10-15 NOTE — Addendum Note (Signed)
Addended by: Lavonia Drafts on: 10/15/2019 11:01 AM   Modules accepted: Orders, SmartSet

## 2019-10-15 NOTE — Progress Notes (Signed)
   PRENATAL VISIT NOTE  Subjective:  Jenny Eaton is a 28 y.o. G1P0000 at [redacted]w[redacted]d being seen today for ongoing prenatal care.  She is currently monitored for the following issues for this low-risk pregnancy and has Hypothyroidism; Migraines; Irritable bowel syndrome (IBS); Abnormal Pap smear of cervix; Elevated blood pressure reading; Microscopic hematuria; Supervision of normal first pregnancy, antepartum; and Hypothyroidism affecting pregnancy on their problem list.  Patient reports no complaints.  Contractions: Not present. Vag. Bleeding: None.  Movement: Present. Denies leaking of fluid.   The following portions of the patient's history were reviewed and updated as appropriate: allergies, current medications, past family history, past medical history, past social history, past surgical history and problem list.   Objective:   Vitals:   10/15/19 0812  BP: 107/80  Pulse: 83  Weight: 163 lb 1.9 oz (74 kg)    Fetal Status: Fetal Heart Rate (bpm): 125 Fundal Height: 36 cm Movement: Present     General:  Alert, oriented and cooperative. Patient is in no acute distress.  Skin: Skin is warm and dry. No rash noted.   Cardiovascular: Normal heart rate noted  Respiratory: Normal respiratory effort, no problems with respiration noted  Abdomen: Soft, gravid, appropriate for gestational age.  Pain/Pressure: Absent     Pelvic: Cervical exam performed        Extremities: Normal range of motion.  Edema: None  Mental Status: Normal mood and affect. Normal behavior. Normal judgment and thought content.   Assessment and Plan:  Pregnancy: G1P0000 at [redacted]w[redacted]d 1. Supervision of normal first pregnancy, antepartum Begin antenatal testing IOL in 1 week if undel. Needs cervical ripening.  Orders placed.  NST- reactive AFI >10cm  2. Hypothyroidism affecting pregnancy, antepartum  Term labor symptoms and general obstetric precautions including but not limited to vaginal bleeding, contractions, leaking  of fluid and fetal movement were reviewed in detail with the patient. Please refer to After Visit Summary for other counseling recommendations.   Return in about 3 days (around 10/18/2019) for in person.  Future Appointments  Date Time Provider [place]  10/18/2019  8:45 AM [facility] NURSE [facility] None  10/22/2019 12:00 AM [facility] SCHED ROOM [facility] None    Lavonia Drafts, MD

## 2019-10-15 NOTE — Patient Instructions (Signed)

## 2019-10-16 ENCOUNTER — Telehealth (HOSPITAL_COMMUNITY): Payer: Self-pay | Admitting: *Deleted

## 2019-10-16 NOTE — Telephone Encounter (Signed)
Preadmission screen  

## 2019-10-17 ENCOUNTER — Telehealth (HOSPITAL_COMMUNITY): Payer: Self-pay | Admitting: *Deleted

## 2019-10-17 NOTE — Telephone Encounter (Signed)
Preadmission screen  

## 2019-10-18 ENCOUNTER — Telehealth (HOSPITAL_COMMUNITY): Payer: Self-pay | Admitting: *Deleted

## 2019-10-18 ENCOUNTER — Other Ambulatory Visit: Payer: Self-pay

## 2019-10-18 ENCOUNTER — Ambulatory Visit (INDEPENDENT_AMBULATORY_CARE_PROVIDER_SITE_OTHER): Payer: BC Managed Care – PPO

## 2019-10-18 DIAGNOSIS — Z3400 Encounter for supervision of normal first pregnancy, unspecified trimester: Secondary | ICD-10-CM

## 2019-10-18 DIAGNOSIS — O99283 Endocrine, nutritional and metabolic diseases complicating pregnancy, third trimester: Secondary | ICD-10-CM

## 2019-10-18 DIAGNOSIS — Z3A40 40 weeks gestation of pregnancy: Secondary | ICD-10-CM

## 2019-10-18 DIAGNOSIS — O99280 Endocrine, nutritional and metabolic diseases complicating pregnancy, unspecified trimester: Secondary | ICD-10-CM

## 2019-10-18 DIAGNOSIS — E039 Hypothyroidism, unspecified: Secondary | ICD-10-CM

## 2019-10-18 NOTE — Telephone Encounter (Signed)
Preadmission screen  

## 2019-10-18 NOTE — Progress Notes (Addendum)
Patient presents for NST. Reviewed induction instructions with her. Also set up postpartum visit for patient. Kathrene Alu RN  NST reviewed and reactive.  HARRAWAY-SMITH, CAROLYN

## 2019-10-19 NOTE — L&D Delivery Note (Signed)
LABOR COURSE Patient was admitted for PDIOL. She received Cytotec and foley balloon placement for cervical ripening. She was augmented with Pitocin and AROM'd at 10cm.   Delivery Note Called to room and patient was complete and pushing. Head delivered ROT. No nuchal cord present. Shoulder and body delivered in usual fashion. At 1252 a viable female was delivered via Vaginal, Spontaneous (Presentation:ROT;ROA).  Infant with spontaneous cry, placed on mother's abdomen, dried and stimulated. Cord clamped x 2 after one-minute delay, and cut by FOB. Cord blood drawn. Placenta delivered spontaneously with gentle cord traction. Appears intact. Fundus firm with massage and Pitocin. Labia, perineum, vagina, and cervix inspected with second degree laceration and bilateral labial abrasions. Abrasions observed to be hemostatic, repair no indicated.  APGAR:9, 9; weight: 3370g  .   Cord: 3VC with the following complications:N/A.   Cord pH: Not indicated  Anesthesia:  Epidural plus Lidocaine for perineal repair Episiotomy: None Lacerations: 2nd degree perineal Suture Repair: 2.0 vicryl rapide Q. Blood Loss (mL): 100  Mom to postpartum.  Baby to Couplet care / Skin to Skin.  Mallie Snooks, CNM 10/22/19 4:01 PM

## 2019-10-20 ENCOUNTER — Other Ambulatory Visit (HOSPITAL_COMMUNITY)
Admission: RE | Admit: 2019-10-20 | Discharge: 2019-10-20 | Disposition: A | Discharge status: Home / Self Care | Payer: BC Managed Care – PPO | Source: Ambulatory Visit | Attending: Family Medicine | Admitting: Family Medicine

## 2019-10-20 DIAGNOSIS — Z01812 Encounter for preprocedural laboratory examination: Secondary | ICD-10-CM | POA: Insufficient documentation

## 2019-10-20 DIAGNOSIS — Z20822 Contact with and (suspected) exposure to covid-19: Secondary | ICD-10-CM | POA: Diagnosis not present

## 2019-10-20 LAB — SARS CORONAVIRUS 2 (TAT 6-24 HRS): SARS Coronavirus 2: NEGATIVE

## 2019-10-22 ENCOUNTER — Inpatient Hospital Stay (HOSPITAL_COMMUNITY)
Admission: AD | Admit: 2019-10-22 | Discharge: 2019-10-24 | DRG: 807 | Disposition: A | Discharge status: Home / Self Care | Payer: BC Managed Care – PPO | Attending: Obstetrics and Gynecology | Admitting: Obstetrics and Gynecology

## 2019-10-22 ENCOUNTER — Encounter (HOSPITAL_COMMUNITY): Payer: BC Managed Care – PPO

## 2019-10-22 ENCOUNTER — Other Ambulatory Visit: Payer: Self-pay

## 2019-10-22 ENCOUNTER — Encounter: Payer: BC Managed Care – PPO | Admitting: Obstetrics & Gynecology

## 2019-10-22 ENCOUNTER — Inpatient Hospital Stay (HOSPITAL_COMMUNITY): Payer: BC Managed Care – PPO | Admitting: Anesthesiology

## 2019-10-22 ENCOUNTER — Encounter (HOSPITAL_COMMUNITY): Payer: Self-pay | Admitting: Obstetrics & Gynecology

## 2019-10-22 ENCOUNTER — Inpatient Hospital Stay (HOSPITAL_COMMUNITY): Payer: BC Managed Care – PPO

## 2019-10-22 DIAGNOSIS — Z3A41 41 weeks gestation of pregnancy: Secondary | ICD-10-CM

## 2019-10-22 DIAGNOSIS — E039 Hypothyroidism, unspecified: Secondary | ICD-10-CM | POA: Diagnosis not present

## 2019-10-22 DIAGNOSIS — Z3490 Encounter for supervision of normal pregnancy, unspecified, unspecified trimester: Secondary | ICD-10-CM

## 2019-10-22 DIAGNOSIS — O480 Post-term pregnancy: Principal | ICD-10-CM | POA: Diagnosis present

## 2019-10-22 DIAGNOSIS — O99284 Endocrine, nutritional and metabolic diseases complicating childbirth: Secondary | ICD-10-CM | POA: Diagnosis present

## 2019-10-22 HISTORY — DX: Hypothyroidism, unspecified: E03.9

## 2019-10-22 LAB — CBC
HCT: 37.5 % (ref 36.0–46.0)
Hemoglobin: 12.6 g/dL (ref 12.0–15.0)
MCH: 31.7 pg (ref 26.0–34.0)
MCHC: 33.6 g/dL (ref 30.0–36.0)
MCV: 94.5 fL (ref 80.0–100.0)
Platelets: 162 10*3/uL (ref 150–400)
RBC: 3.97 MIL/uL (ref 3.87–5.11)
RDW: 12.6 % (ref 11.5–15.5)
WBC: 8.3 10*3/uL (ref 4.0–10.5)
nRBC: 0 % (ref 0.0–0.2)

## 2019-10-22 LAB — TYPE AND SCREEN
ABO/RH(D): O POS
Antibody Screen: NEGATIVE

## 2019-10-22 LAB — ABO/RH: ABO/RH(D): O POS

## 2019-10-22 MED ORDER — COCONUT OIL OIL: 1.0000 "application " | TOPICAL_OIL | Status: DC | PRN

## 2019-10-22 MED ORDER — ONDANSETRON HCL 4 MG/2ML IJ SOLN: 4.0000 mg | INTRAMUSCULAR | Status: DC | PRN

## 2019-10-22 MED ORDER — DIPHENHYDRAMINE HCL 50 MG/ML IJ SOLN: 12.5000 mg | INTRAMUSCULAR | Status: DC | PRN

## 2019-10-22 MED ORDER — OXYTOCIN 40 UNITS IN NORMAL SALINE INFUSION - SIMPLE MED
1.0000 m[IU]/min | INTRAVENOUS | Status: DC
  Administered 2019-10-22: 2 m[IU]/min via INTRAVENOUS

## 2019-10-22 MED ORDER — MISOPROSTOL 50MCG HALF TABLET
50.0000 ug | ORAL_TABLET | Freq: Once | ORAL | Status: AC
  Administered 2019-10-22: 50 ug via ORAL
  Filled 2019-10-22: qty 1

## 2019-10-22 MED ORDER — IBUPROFEN 600 MG PO TABS
600.0000 mg | ORAL_TABLET | Freq: Four times a day (QID) | ORAL | Status: DC
  Administered 2019-10-22 – 2019-10-24 (×8): 600 mg via ORAL
  Filled 2019-10-22 (×8): qty 1

## 2019-10-22 MED ORDER — OXYTOCIN 40 UNITS IN NORMAL SALINE INFUSION - SIMPLE MED
2.5000 [IU]/h | INTRAVENOUS | Status: DC
  Filled 2019-10-22: qty 1000

## 2019-10-22 MED ORDER — DIPHENHYDRAMINE HCL 25 MG PO CAPS: 25.0000 mg | ORAL_CAPSULE | Freq: Four times a day (QID) | ORAL | Status: DC | PRN

## 2019-10-22 MED ORDER — IBUPROFEN 600 MG PO TABS: 600.0000 mg | ORAL_TABLET | Freq: Four times a day (QID) | ORAL | Status: DC | PRN

## 2019-10-22 MED ORDER — PHENYLEPHRINE 40 MCG/ML (10ML) SYRINGE FOR IV PUSH (FOR BLOOD PRESSURE SUPPORT): 80.0000 ug | PREFILLED_SYRINGE | INTRAVENOUS | Status: DC | PRN

## 2019-10-22 MED ORDER — OXYCODONE-ACETAMINOPHEN 5-325 MG PO TABS: 2.0000 | ORAL_TABLET | ORAL | Status: DC | PRN

## 2019-10-22 MED ORDER — OXYCODONE-ACETAMINOPHEN 5-325 MG PO TABS: 1.0000 | ORAL_TABLET | ORAL | Status: DC | PRN

## 2019-10-22 MED ORDER — SODIUM CHLORIDE (PF) 0.9 % IJ SOLN
INTRAMUSCULAR | Status: DC | PRN
  Administered 2019-10-22: 12 mL/h via EPIDURAL

## 2019-10-22 MED ORDER — LACTATED RINGERS IV SOLN: INTRAVENOUS | Status: DC

## 2019-10-22 MED ORDER — MAGNESIUM HYDROXIDE 400 MG/5ML PO SUSP: 30.0000 mL | ORAL | Status: DC | PRN

## 2019-10-22 MED ORDER — SOD CITRATE-CITRIC ACID 500-334 MG/5ML PO SOLN: 30.0000 mL | ORAL | Status: DC | PRN

## 2019-10-22 MED ORDER — PRENATAL MULTIVITAMIN CH
1.0000 | ORAL_TABLET | Freq: Every day | ORAL | Status: DC
  Administered 2019-10-23 – 2019-10-24 (×2): 1 via ORAL
  Filled 2019-10-22 (×2): qty 1

## 2019-10-22 MED ORDER — WITCH HAZEL-GLYCERIN EX PADS: 1.0000 "application " | MEDICATED_PAD | CUTANEOUS | Status: DC | PRN

## 2019-10-22 MED ORDER — FERROUS SULFATE 325 (65 FE) MG PO TABS
325.0000 mg | ORAL_TABLET | Freq: Two times a day (BID) | ORAL | Status: DC
  Administered 2019-10-22 – 2019-10-24 (×4): 325 mg via ORAL
  Filled 2019-10-22 (×4): qty 1

## 2019-10-22 MED ORDER — SIMETHICONE 80 MG PO CHEW: 80.0000 mg | CHEWABLE_TABLET | ORAL | Status: DC | PRN

## 2019-10-22 MED ORDER — LEVOTHYROXINE SODIUM 50 MCG PO TABS
50.0000 ug | ORAL_TABLET | Freq: Every day | ORAL | Status: DC
  Administered 2019-10-22: 50 ug via ORAL
  Filled 2019-10-22: qty 1

## 2019-10-22 MED ORDER — ACETAMINOPHEN 325 MG PO TABS: 650.0000 mg | ORAL_TABLET | ORAL | Status: DC | PRN

## 2019-10-22 MED ORDER — BENZOCAINE-MENTHOL 20-0.5 % EX AERO
1.0000 "application " | INHALATION_SPRAY | CUTANEOUS | Status: DC | PRN
  Administered 2019-10-22: 1 via TOPICAL
  Filled 2019-10-22: qty 56

## 2019-10-22 MED ORDER — LIDOCAINE HCL (PF) 1 % IJ SOLN
30.0000 mL | INTRAMUSCULAR | Status: AC | PRN
  Administered 2019-10-22: 30 mL via SUBCUTANEOUS
  Filled 2019-10-22: qty 30

## 2019-10-22 MED ORDER — LACTATED RINGERS IV SOLN: 500.0000 mL | Freq: Once | INTRAVENOUS | Status: DC

## 2019-10-22 MED ORDER — LACTATED RINGERS IV SOLN: 500.0000 mL | INTRAVENOUS | Status: DC | PRN

## 2019-10-22 MED ORDER — LEVOTHYROXINE SODIUM 50 MCG PO TABS: 50.0000 ug | ORAL_TABLET | Freq: Every day | ORAL | Status: DC

## 2019-10-22 MED ORDER — LIDOCAINE HCL (PF) 1 % IJ SOLN
INTRAMUSCULAR | Status: DC | PRN
  Administered 2019-10-22 (×2): 4 mL via EPIDURAL

## 2019-10-22 MED ORDER — OXYTOCIN BOLUS FROM INFUSION
500.0000 mL | Freq: Once | INTRAVENOUS | Status: AC
  Administered 2019-10-22: 500 mL via INTRAVENOUS

## 2019-10-22 MED ORDER — BUTORPHANOL TARTRATE 1 MG/ML IJ SOLN
1.0000 mg | INTRAMUSCULAR | Status: DC | PRN
  Administered 2019-10-22: 1 mg via INTRAVENOUS
  Filled 2019-10-22: qty 1

## 2019-10-22 MED ORDER — MISOPROSTOL 25 MCG QUARTER TABLET
25.0000 ug | ORAL_TABLET | ORAL | Status: DC | PRN
  Administered 2019-10-22: 25 ug via VAGINAL
  Filled 2019-10-22 (×3): qty 1

## 2019-10-22 MED ORDER — MEASLES, MUMPS & RUBELLA VAC IJ SOLR: 0.5000 mL | Freq: Once | INTRAMUSCULAR | Status: DC

## 2019-10-22 MED ORDER — LEVOTHYROXINE SODIUM 50 MCG PO TABS
50.0000 ug | ORAL_TABLET | ORAL | Status: DC
  Administered 2019-10-23 – 2019-10-24 (×2): 50 ug via ORAL
  Filled 2019-10-22 (×2): qty 1

## 2019-10-22 MED ORDER — LEVOTHYROXINE SODIUM 50 MCG PO TABS: 50.0000 ug | ORAL_TABLET | ORAL | Status: DC

## 2019-10-22 MED ORDER — EPHEDRINE 5 MG/ML INJ: 10.0000 mg | INTRAVENOUS | Status: DC | PRN

## 2019-10-22 MED ORDER — FENTANYL-BUPIVACAINE-NACL 0.5-0.125-0.9 MG/250ML-% EP SOLN
EPIDURAL | Status: AC
  Filled 2019-10-22: qty 250

## 2019-10-22 MED ORDER — FENTANYL-BUPIVACAINE-NACL 0.5-0.125-0.9 MG/250ML-% EP SOLN: 12.0000 mL/h | EPIDURAL | Status: DC | PRN

## 2019-10-22 MED ORDER — TETANUS-DIPHTH-ACELL PERTUSSIS 5-2.5-18.5 LF-MCG/0.5 IM SUSP: 0.5000 mL | Freq: Once | INTRAMUSCULAR | Status: DC

## 2019-10-22 MED ORDER — TERBUTALINE SULFATE 1 MG/ML IJ SOLN: 0.2500 mg | Freq: Once | INTRAMUSCULAR | Status: DC | PRN

## 2019-10-22 MED ORDER — ONDANSETRON HCL 4 MG PO TABS: 4.0000 mg | ORAL_TABLET | ORAL | Status: DC | PRN

## 2019-10-22 MED ORDER — ONDANSETRON HCL 4 MG/2ML IJ SOLN: 4.0000 mg | Freq: Four times a day (QID) | INTRAMUSCULAR | Status: DC | PRN

## 2019-10-22 MED ORDER — LEVOTHYROXINE SODIUM 50 MCG PO TABS: 100.0000 ug | ORAL_TABLET | ORAL | Status: DC

## 2019-10-22 MED ORDER — DIBUCAINE (PERIANAL) 1 % EX OINT: 1.0000 "application " | TOPICAL_OINTMENT | CUTANEOUS | Status: DC | PRN

## 2019-10-22 MED ORDER — LIDOCAINE-EPINEPHRINE (PF) 2 %-1:200000 IJ SOLN
INTRAMUSCULAR | Status: DC | PRN
  Administered 2019-10-22: 3 mL via EPIDURAL

## 2019-10-22 NOTE — H&P (Addendum)
Jenny Eaton is a 29 y.o. female G1P1 at 41+0 by LMP c/w [place] at 10+2, presenting for IOL for postdates (10/05/19).  Patient's pmhx is s/f hypothyroidism 2/2 hx of Hashimotos, but has had an otherwise normal pregnancy.   Ultrasounds 05/21/19 - normal initial anatomy scan  06/18/19 - completion of normal anatomy scan -- two small foci on baby's heart associated with possible trisomy. CF DNA obtained and negative.   Allergies  Allergen Reactions   Cephalosporins Hives   Penicillins Hives   Current Outpatient Medications  Medication Instructions   levothyroxine (SYNTHROID) 50 mcg, Oral, As directed, 10 tablets weekly   Prenatal Vit-Fe Fumarate-FA (PRENATAL VITAMINS PO) Oral   SUMAtriptan (IMITREX) 50 MG tablet May repeat in 2 hours if headache persists (max 2 tabs/24 hrs)   OB History     Gravida  1   Para  0   Term  0   Preterm  0   AB  0   Living  0      SAB  0   TAB  0   Ectopic  0   Multiple  0   Live Births             Past Medical History:  Diagnosis Date   History of Hashimoto thyroiditis 2010   Hypothyroidism    Microscopic hematuria    negative work up 2019   Migraines    Thyroid disease    hypothyroid   Past Surgical History:  Procedure Laterality Date   excision of vaginal septum  2008   WISDOM TOOTH EXTRACTION  2011   Family History: family history includes Cancer in her maternal grandmother; Diabetes in her maternal grandfather; Hashimoto's thyroiditis in her maternal grandmother; Heart disease in her maternal grandfather; Hyperlipidemia in her mother; Hypertension in her maternal grandfather and paternal grandmother; Kidney cancer in her maternal grandmother; Pulmonary embolism in her maternal grandmother; Stroke in her paternal grandmother. Social History:  reports that she has never smoked. She has never used smokeless tobacco. She reports previous alcohol use. She reports that she does not use drugs.     Maternal Diabetes: No Genetic  Screening: Normal Maternal Ultrasounds/Referrals: Normal Fetal Ultrasounds or other Referrals:  None Maternal Substance Abuse:  No Significant Maternal Medications:  Meds include: Other: levothyroxine Significant Maternal Lab Results:  Group B Strep negative Other Comments:  None  History   Last menstrual period 01/08/2019.  Physical Exam BP 123/79 (BP Location: Left Arm)   Pulse 80   Temp 98.2 F (36.8 C) (Oral)   Resp 18   Ht 5\' 7"  (1.702 m)   Wt 73.7 kg   LMP 01/08/2019 (Exact Date)   SpO2 100%   BMI 25.45 kg/m  Well appearing female. NAD. Lying comfortably in bed.   Dilation: Fingertip Effacement (%): 60 Station: Ballotable Presentation: Vertex Exam by:: Dr. Maudie Mercury  Prenatal labs: ABO, Rh: O/Positive/-- (06/04 0953) Antibody: Negative (06/04 0953) Rubella: 3.62 (06/04 0953) RPR: Non Reactive (10/08 0918)  HBsAg: Negative (06/04 0953)  HIV: Non Reactive (10/08 0918)  GBS: Negative/-- (12/01 0823)   Assessment/Plan: Jenny Eaton is a 28 y.o. G1P0000 at [redacted]w[redacted]d - presenting for IOL for postdates (10/05/19). Her pregnancy was complicated by hypothyroidism which was well managed. Most recent thyroid testing (07/24/19) show normal values.  She denies pain or feeling contractions at this time.   Labor: Placed Vaginal cyctoec. Check back in 3-4 hours..  Pain control:  anticipate epidural Anticipated MOD: NSVD PPH Risk: primip - low  Fetal Wellbeing: Cat 1 tracing - basline 135, moderate var, + accels, - decels GBS Negative/-- (12/01 [id number])  Continuous fetal monitoring  Wilber Oliphant 10/22/2019, 12:20 AM  I confirm that I have verified the information documented in the resident's note and that I have also personally reperformed the history, physical exam and all medical decision making activities of this service and have verified that all service and findings are accurately documented in this student's note.   Wende Mott, [place] 10/22/2019 3:09 AM

## 2019-10-22 NOTE — Progress Notes (Signed)
Jenny Eaton is a 29 y.o. G1P0000 at [redacted]w[redacted]d   Subjective: Reporting contraction pain 8-9/10. Coping well with distraction. Desires epidural in active labor  Objective: BP 103/65   Pulse 72   Temp 97.6 F (36.4 C) (Axillary)   Resp 18   Ht 5\' 7"  (1.702 m)   Wt 73.7 kg   LMP 01/08/2019 (Exact Date)   SpO2 99%   BMI 25.45 kg/m  No intake/output data recorded. No intake/output data recorded.  FHT:  FHR: 125 bpm, variability: moderate,  accelerations:  Present,  decelerations:  Absent UC:   irregular, every 2-5 minutes SVE:   Dilation: 5.5 Effacement (%): 70 Station: -3 Exam by:: Rosine Abe CNM   Labs: Lab Results  Component Value Date   WBC 8.3 10/22/2019   HGB 12.6 10/22/2019   HCT 37.5 10/22/2019   MCV 94.5 10/22/2019   PLT 162 10/22/2019    Assessment / Plan: --28 y.o. G1P0000 at [redacted]w[redacted]d for PDIOL --Category I tracing --GBS NEG --S/p Cytotec x 2 and foley balloon --Updated exam 5.5/70/-3 vertex by suture --Patient may have epidural --Plan to initiate Pitocin 2 x 2 when comfortable with epidural --Anticipate NSVD  Darlina Rumpf, CNM 10/22/2019, [phone]

## 2019-10-22 NOTE — Discharge Instructions (Signed)

## 2019-10-22 NOTE — Discharge Summary (Addendum)
Postpartum Discharge Summary     Patient Name: Jenny Eaton DOB: [birth year] MRN: [id number]  Date of admission: 10/22/2019 Delivering Provider: Darlina Rumpf   Date of discharge: 10/24/2019  Admitting diagnosis: Post-dates pregnancy [O48.0] Intrauterine pregnancy: [redacted]w[redacted]d    Secondary diagnosis:  Active Problems:   Encounter for induction of labor  Additional problems: none     Discharge diagnosis: Term Pregnancy Delivered                                                                                                Post partum procedures:N/A  Augmentation: AROM, Pitocin, Cytotec and Foley Balloon  Complications: None  Hospital course:  Induction of Labor With Vaginal Delivery   29y.o. yo G1P0000 at 422w0das admitted to the hospital 10/22/2019 for induction of labor.  Indication for induction: Postdates.  Patient had an uncomplicated labor course as follows: Membrane Rupture Time/Date: 12:24 PM ,10/22/2019   Intrapartum Procedures: Episiotomy: None [1]                                         Lacerations:  2nd degree [3];Perineal [11]  Patient had delivery of a Viable infant.  Information for the patient's newborn:  KrAlandria, Butkiewicz0[[id number]]Delivery Method: Vaginal, Spontaneous(Filed from Delivery Summary)    10/22/2019  Details of delivery can be found in separate delivery note.  Patient had a routine postpartum course. Patient is discharged home 10/24/19. Delivery time: 12:52 PM    Magnesium Sulfate received: No BMZ received: No Rhophylac:No MMR:N/A Transfusion:No  Physical exam  Vitals:   10/22/19 2301 10/23/19 0355 10/23/19 1620 10/23/19 2256  BP:  109/82 111/75 112/77  Pulse: 67 66 67 72  Resp: '18 18 17 18  '$ Temp: 98.2 F (36.8 C) 97.9 F (36.6 C) 98.1 F (36.7 C) 98.4 F (36.9 C)  TempSrc: Oral Oral Oral Oral  SpO2:   98%   Weight:      Height:       General: alert, cooperative and no distress Lochia: appropriate Uterine Fundus:  firm Incision: N/A DVT Evaluation: No evidence of DVT seen on physical exam. Labs: Lab Results  Component Value Date   WBC 8.3 10/22/2019   HGB 12.6 10/22/2019   HCT 37.5 10/22/2019   MCV 94.5 10/22/2019   PLT 162 10/22/2019   CMP Latest Ref Rng & Units 02/06/2018  Glucose 70 - 99 mg/dL 92  BUN 6 - 23 mg/dL 12  Creatinine 0.40 - 1.20 mg/dL 0.69  Sodium 135 - 145 mEq/L 137  Potassium 3.5 - 5.1 mEq/L 4.0  Chloride 96 - 112 mEq/L 104  CO2 19 - 32 mEq/L 28  Calcium 8.4 - 10.5 mg/dL 9.3  Total Protein 6.0 - 8.3 g/dL 6.8  Total Bilirubin 0.2 - 1.2 mg/dL 0.2  Alkaline Phos 39 - 117 U/L 34(L)  AST 0 - 37 U/L 15  ALT 0 - 35 U/L 9    Discharge instruction: per After Visit Summary and "Baby and  Me Booklet".  After visit meds:  Allergies as of 10/24/2019      Reactions   Cephalosporins Hives   Penicillins Hives      Medication List    TAKE these medications   acetaminophen 325 MG tablet Commonly known as: Tylenol Take 2 tablets (650 mg total) by mouth every 4 (four) hours as needed (for pain scale < 4).   ibuprofen 600 MG tablet Commonly known as: ADVIL Take 1 tablet (600 mg total) by mouth every 6 (six) hours.   levothyroxine 50 MCG tablet Commonly known as: SYNTHROID Take 1 tablet (50 mcg total) by mouth as directed. 10 tablets weekly   PRENATAL VITAMINS PO Take by mouth.   SUMAtriptan 50 MG tablet Commonly known as: Imitrex May repeat in 2 hours if headache persists (max 2 tabs/24 hrs)       Diet: routine diet  Activity: Advance as tolerated. Pelvic rest for 6 weeks.   Outpatient follow up:4 weeks Follow up Appt: Future Appointments  Date Time Provider [place]  11/19/2019 10:00 AM Lavonia Drafts, MD [facility] None   Follow up Visit (Message sent to clinic admin on 10/21/2018):   Please schedule this patient for Postpartum visit in: 4 weeks with the following provider: Any provider For C/S patients schedule nurse incision check in weeks 2  weeks: no Low risk pregnancy complicated by: N/A Delivery mode:  SVD Anticipated Birth Control:  POPs PP Procedures needed: N/A  Schedule Integrated [place] visit: no   Newborn Data: Live born female  Birth Weight:  13 APGAR: 9, 9  Newborn Delivery   Birth date/time: 10/22/2019 12:52:00 Delivery type: Vaginal, Spontaneous     Baby Feeding: Breast Disposition:home with mother   10/24/2019 Wilber Oliphant, MD  I confirm that I have verified the information documented in the resident's note and that I have also personally reperformed the history, physical exam and all medical decision making activities of this service and have verified that all service and findings are accurately documented in this student's note.   -Patient is bottle feeding. Discussed methods for suppressing milk including cold compresses, support bras, and tylenol for pain.  -Reviewed PP precautions including elevated blood pressures, bleeding, and pain. -Encouraged to contact office for any issues.  -Discussed usage of oral contraception initiation at PPV.  Gavin Pound, [place] 10/24/2019 11:09 AM

## 2019-10-22 NOTE — Progress Notes (Signed)
Jenny Eaton is a 29 y.o. G1P0000 at [redacted]w[redacted]d by LMP admitted for induction of labor due to Post dates. Due date 10/05/19.  Subjective: She is resting comfortably in her room.  Is been sleeping for the last 3 hours.  Objective: BP 100/61   Pulse 74   Temp 98.2 F (36.8 C) (Oral)   Resp 18   Ht 5\' 7"  (1.702 m)   Wt 73.7 kg   LMP 01/08/2019 (Exact Date)   SpO2 100%   BMI 25.45 kg/m  No intake/output data recorded. No intake/output data recorded.  FHT:  FHR: 120 bpm, variability: moderate,  accelerations:  Present,  decelerations:  Absent UC:   irregular SVE:   Dilation: 1.5 Effacement (%): 70, 80 Station: -3 Exam by:: Maudie Mercury MD  Labs: Lab Results  Component Value Date   WBC 8.3 10/22/2019   HGB 12.6 10/22/2019   HCT 37.5 10/22/2019   MCV 94.5 10/22/2019   PLT 162 10/22/2019    Assessment / Plan: G1P0 IOL for postdates.  Labor:  Dosed second Cytotec and placed.  Cervix continues to soften.  Will reassess when FB falls out.  Patient feeling uncomfortable after Foley balloon placement.  She requested Stadol. Fetal Wellbeing:  Category I Pain Control:  IV pain meds, anticipate epidural I/D:  n/a Anticipated MOD:  NSVD  Wilber Oliphant 10/22/2019, 5:24 AM

## 2019-10-22 NOTE — Progress Notes (Signed)
CNM at bedside to assess FHR deceleration. Patient had rolled to left side at time of suspected decel. FHR now reassuring. Will continue to monitor.   Wende Mott, CNM  10/22/19 3:01 AM

## 2019-10-22 NOTE — Anesthesia Preprocedure Evaluation (Addendum)
Anesthesia Evaluation  Patient identified by MRN, date of birth, ID band Patient awake    Reviewed: Allergy & Precautions, Patient's Chart, lab work & pertinent test results  History of Anesthesia Complications Negative for: history of anesthetic complications  Airway Mallampati: II  TM Distance: >3 FB Neck ROM: Full    Dental no notable dental hx.    Pulmonary neg pulmonary ROS,    Pulmonary exam normal        Cardiovascular negative cardio ROS Normal cardiovascular exam     Neuro/Psych negative neurological ROS  negative psych ROS   GI/Hepatic negative GI ROS, Neg liver ROS,   Endo/Other  Hypothyroidism   Renal/GU negative Renal ROS  negative genitourinary   Musculoskeletal negative musculoskeletal ROS (+)   Abdominal   Peds  Hematology negative hematology ROS (+)   Anesthesia Other Findings Day of surgery medications reviewed with patient.  Reproductive/Obstetrics (+) Pregnancy                             Anesthesia Physical Anesthesia Plan  ASA: II  Anesthesia Plan: Epidural   Post-op Pain Management:    Induction:   PONV Risk Score and Plan: Treatment may vary due to age or medical condition  Airway Management Planned: Natural Airway  Additional Equipment:   Intra-op Plan:   Post-operative Plan:   Informed Consent: I have reviewed the patients History and Physical, chart, labs and discussed the procedure including the risks, benefits and alternatives for the proposed anesthesia with the patient or authorized representative who has indicated his/her understanding and acceptance.       Plan Discussed with:   Anesthesia Plan Comments:         Anesthesia Quick Evaluation

## 2019-10-22 NOTE — Anesthesia Procedure Notes (Signed)
Epidural Patient location during procedure: OB Start time: 10/22/2019 9:29 AM End time: 10/22/2019 9:44 AM  Staffing Anesthesiologist: Brennan Bailey, MD Performed: anesthesiologist   Preanesthetic Checklist Completed: patient identified, IV checked, risks and benefits discussed, monitors and equipment checked, pre-op evaluation and timeout performed  Epidural Patient position: sitting Prep: DuraPrep and site prepped and draped Patient monitoring: continuous pulse ox, blood pressure and heart rate Approach: midline Location: L3-L4 Injection technique: LOR air  Needle:  Needle type: Tuohy  Needle gauge: 17 G Needle length: 9 cm Needle insertion depth: 5 cm Catheter type: closed end flexible Catheter size: 19 Gauge Catheter at skin depth: 10 cm Test dose: negative and Other (1% lidocaine)  Assessment Events: injection not painful, no injection resistance and no paresthesia  Additional Notes Patient identified. Risks, benefits, and alternatives discussed with patient including but not limited to bleeding, infection, nerve damage, paralysis, failed block, incomplete pain control, headache, blood pressure changes, nausea, vomiting, reactions to medication, itching, and postpartum back pain. Confirmed with bedside nurse the patient's most recent platelet count. Confirmed with patient that they are not currently taking any anticoagulation, have any bleeding history, or any family history of bleeding disorders. Patient expressed understanding and wished to proceed. All questions were answered. Sterile technique was used throughout the entire procedure. Please see nursing notes for vital signs.   Epidural catheter placed x3 without difficulty (one pass each insertion): #1-L3-4. Aspiration of heme after catheter placed, pulled back 2cm and still with heme aspiration. Test dose positive with lidocaine 2% with epi, HR 120s from 70s. Catheter removed.  #2-L2-3. Catheter placed easily, would  not inject, suspect catheter defect. Catheter removed. #3-L2-3 at same site as previous. New catheter injects easily. Negative test dose.  Test dose was given through epidural catheter and negative prior to continuing to dose epidural or start infusion. Warning signs of high block given to the patient including shortness of breath, tingling/numbness in hands, complete motor block, or any concerning symptoms with instructions to call for help. Patient was given instructions on fall risk and not to get out of bed. All questions and concerns addressed with instructions to call with any issues or inadequate analgesia.  Reason for block:procedure for pain

## 2019-10-22 NOTE — Anesthesia Postprocedure Evaluation (Signed)
Anesthesia Post Note  Patient: Jenny Eaton  Procedure(s) Performed: AN AD [place]     Patient location during evaluation: Mother Baby Anesthesia Type: Epidural Level of consciousness: awake and alert Pain management: pain level controlled Vital Signs Assessment: post-procedure vital signs reviewed and stable Respiratory status: spontaneous breathing, nonlabored ventilation and respiratory function stable Cardiovascular status: stable Postop Assessment: no headache, no backache, epidural receding, adequate PO intake, able to ambulate, no apparent nausea or vomiting and patient able to bend at knees Anesthetic complications: no    Last Vitals:  Vitals:   10/22/19 1451 10/22/19 1611  BP: 123/83 102/66  Pulse: 65 80  Resp: 18 17  Temp: 36.8 C 36.7 C  SpO2: 99%     Last Pain:  Vitals:   10/22/19 1746  TempSrc:   PainSc: 0-No pain   Pain Goal:                   Talitha Givens

## 2019-10-23 LAB — RPR: RPR Ser Ql: NONREACTIVE

## 2019-10-23 NOTE — Plan of Care (Signed)
  Problem: Clinical Measurements: Goal: Ability to maintain clinical measurements within normal limits will improve Outcome: Completed/Met Goal: Will remain free from infection Outcome: Completed/Met Goal: Diagnostic test results will improve Outcome: Completed/Met Goal: Respiratory complications will improve Outcome: Completed/Met Goal: Cardiovascular complication will be avoided Outcome: Completed/Met   Problem: Activity: Goal: Risk for activity intolerance will decrease Outcome: Completed/Met   Problem: Nutrition: Goal: Adequate nutrition will be maintained Outcome: Completed/Met   Problem: Pain Managment: Goal: General experience of comfort will improve Outcome: Completed/Met   Problem: Safety: Goal: Ability to remain free from injury will improve Outcome: Completed/Met

## 2019-10-23 NOTE — Progress Notes (Signed)
Post Partum Day 1 Subjective: no complaints, up ad lib, voiding, tolerating PO and + flatus No pain, taking motrin PRN Objective: Blood pressure 109/82, pulse 66, temperature 97.9 F (36.6 C), temperature source Oral, resp. rate 18, height 5\' 7"  (1.702 m), weight 73.7 kg, last menstrual period 01/08/2019, SpO2 99 %, unknown if currently breastfeeding.  Physical Exam:  General: alert, cooperative and no distress Lochia: appropriate Uterine Fundus: firm, U-1 Incision: no significant drainage, no dehiscence DVT Evaluation: No evidence of DVT seen on physical exam. No significant calf/ankle edema.  Recent Labs    10/22/19 0013  HGB 12.6  HCT 37.5    Assessment/Plan: Plan for discharge tomorrow   LOS: 1 day   Darlin Coco, SNM 10/23/2019, 7:30 AM

## 2019-10-24 MED ORDER — ACETAMINOPHEN 325 MG PO TABS: 650.0000 mg | ORAL_TABLET | ORAL | Status: DC | PRN

## 2019-10-24 MED ORDER — IBUPROFEN 600 MG PO TABS: 600.0000 mg | ORAL_TABLET | Freq: Four times a day (QID) | ORAL | 0 refills | Status: DC

## 2019-11-19 ENCOUNTER — Encounter: Payer: Self-pay | Admitting: Obstetrics & Gynecology

## 2019-11-19 ENCOUNTER — Other Ambulatory Visit: Payer: Self-pay

## 2019-11-19 ENCOUNTER — Ambulatory Visit (INDEPENDENT_AMBULATORY_CARE_PROVIDER_SITE_OTHER): Payer: BC Managed Care – PPO | Admitting: Obstetrics & Gynecology

## 2019-11-19 VITALS — BP 107/81 | HR 84 | Ht 66.0 in | Wt 148.0 lb

## 2019-11-19 DIAGNOSIS — E038 Other specified hypothyroidism: Secondary | ICD-10-CM | POA: Diagnosis not present

## 2019-11-19 DIAGNOSIS — Z3009 Encounter for other general counseling and advice on contraception: Secondary | ICD-10-CM

## 2019-11-19 DIAGNOSIS — K5900 Constipation, unspecified: Secondary | ICD-10-CM

## 2019-11-19 DIAGNOSIS — E039 Hypothyroidism, unspecified: Secondary | ICD-10-CM

## 2019-11-19 MED ORDER — NORGESTIM-ETH ESTRAD TRIPHASIC 0.18/0.215/0.25 MG-25 MCG PO TABS: 1.0000 | ORAL_TABLET | Freq: Every day | ORAL | 11 refills | Status: DC

## 2019-11-19 NOTE — Progress Notes (Signed)
Subjective:     Jenny Eaton is a 29 y.o. female who presents for a postpartum visit. She is 4 weeks postpartum following a spontaneous vaginal delivery. I have fully reviewed the prenatal and intrapartum course. The delivery was at 19 gestational weeks. Outcome: spontaneous vaginal delivery. Anesthesia: epidural. Postpartum course has been complicated with constipation. Baby's course has been no issues. Baby is feeding by bottle - Similac Advance. Bleeding staining only. Bowel function is normal. Bladder function is normal. Patient is not sexually active. Contraception method is OCP (estrogen/progesterone)- desires to being same OCPs as prepregnancy. Postpartum depression screening: negative.  The following portions of the patient's history were reviewed and updated as appropriate: allergies, current medications, past family history, past medical history, past social history, past surgical history and problem list.  Review of Systems Pertinent items are noted in HPI.   Objective:  BP 107/81   Pulse 84   Ht 5\' 6"  (1.676 m)   Wt 148 lb (67.1 kg)   BMI 23.89 kg/m   CONSTITUTIONAL: Well-developed, well-nourished female in no acute distress.  HENT:  Normocephalic, atraumatic EYES: Conjunctivae and EOM are normal. No scleral icterus.  NECK: Normal range of motion SKIN: Skin is warm and dry. No rash noted. Not diaphoretic.No pallor. [place]: Alert and oriented to person, place, and time. Normal coordination.  GU: EGBUS: no lesions; perineal incision healing well.   Vagina: no blood in vault Cervix: no lesion; no mucopurulent d/c Uterus: small, mobile Adnexa: no masses; non tender  Rectum: firm stool in rectum        Pap 03/22/2019 WNL Assessment:     4 weeks\ postpartum exam. Pap smear not done at today's visit.   Contraception counseling will restart Orthotricyclen   H/o hypothyroidism has appt with endocrinologist next week. Will order labs today.   Constipation   Plan:    1.  Contraception: OCP (estrogen/progesterone) 2. Ortho-tricyclen 1 po q day  3. Follow up in: 3 months with nurse for BP check or as needed.   4. F/u in 1 year for annual  5. Increased fiber in diet.   Samiyah Stupka L. Harraway-Smith, M.D., Cherlynn June

## 2019-11-19 NOTE — Patient Instructions (Signed)

## 2019-11-20 LAB — THYROID PANEL WITH TSH
Free Thyroxine Index: 2 (ref 1.2–4.9)
T3 Uptake Ratio: 25 % (ref 24–39)
T4, Total: 8.1 ug/dL (ref 4.5–12.0)
TSH: 0.882 u[IU]/mL (ref 0.450–4.500)

## 2019-12-03 ENCOUNTER — Other Ambulatory Visit: Payer: Self-pay

## 2019-12-03 ENCOUNTER — Encounter: Payer: Self-pay | Admitting: Internal Medicine

## 2019-12-03 ENCOUNTER — Ambulatory Visit: Payer: BC Managed Care – PPO | Admitting: Internal Medicine

## 2019-12-03 VITALS — BP 104/78 | HR 81 | Temp 98.7°F | Ht 66.0 in | Wt 148.8 lb

## 2019-12-03 DIAGNOSIS — E039 Hypothyroidism, unspecified: Secondary | ICD-10-CM | POA: Diagnosis not present

## 2019-12-03 LAB — TSH: TSH: 1.28 u[IU]/mL (ref 0.35–4.50)

## 2019-12-03 LAB — T4, FREE: Free T4: 0.89 ng/dL (ref 0.60–1.60)

## 2019-12-03 MED ORDER — LEVOTHYROXINE SODIUM 50 MCG PO TABS: 50.0000 ug | ORAL_TABLET | Freq: Every day | ORAL | 3 refills | Status: DC

## 2019-12-03 NOTE — Progress Notes (Signed)
Name: Wynelle Walzer  MRN/ DOB: [id number], 1991-02-08    Age/ Sex: 29 y.o., female     PCP: Debbrah Alar, NP   Reason for Endocrinology Evaluation: Hypothyroidism     Initial Endocrinology Clinic Visit: 01/03/2019    PATIENT IDENTIFIER: Ms. Persephone Poggioli is a 29 y.o., female with a past medical history of migraine headaches and hypothyroidism. She has followed with [facility] since 01/03/2019 for consultative assistance with management of her hypothyroidism.   HISTORICAL SUMMARY: The patient was first diagnosed with hypothyroidism in 2006, she was in 8th grade at the time with hair loss, fatigue and dry eyes.   She has been on levothyroxine 50 mcg daily for a long time , she was also on OCP's but stopped them in October,2019 and started prenatals and that's when her TFT's became abnormal with a TSH of 0.34 uIU/mL. She was advised by her PCP to reduce the dose to 25 mcg with repeat TST of 6.54 uIU/mL.    S/P normal vaginal delivery 10/22/2019  SUBJECTIVE:   During last visit (07/24/2019): TSH was 1.44 uIU/mL. She was maintained on 50 mcg tabs at 10 tabs weekly    Today (12/03/2019):  Ms. Drumwright is here for a 3 month  follow up appointment on her hypothyroidism. She is S/P normal spontaneous vaginal delivery 10/22/2019  She is currently on her pre-pregnancy dose of  levothyroxine 50 mcg daily. Denies abdominal pain   She is compliant with LT-4 replacement , she takes it appropriately.   ROS:  As per HPI.    HISTORY:  Past Medical History:  Past Medical History:  Diagnosis Date  . History of Hashimoto thyroiditis 2010  . Hypothyroidism   . Microscopic hematuria    negative work up 2019  . Migraines   . Thyroid disease    hypothyroid   Past Surgical History:  Past Surgical History:  Procedure Laterality Date  . excision of vaginal septum  2008  . WISDOM TOOTH EXTRACTION  2011    Social History:  reports that she has never smoked. She has  never used smokeless tobacco. She reports previous alcohol use. She reports that she does not use drugs. Family History:  Family History  Problem Relation Age of Onset  . Hyperlipidemia Mother   . Hashimoto's thyroiditis Maternal Grandmother   . Kidney cancer Maternal Grandmother   . Pulmonary embolism Maternal Grandmother   . Cancer Maternal Grandmother   . Diabetes Maternal Grandfather   . Heart disease Maternal Grandfather        CAD- living, ?hx of cabg  . Hypertension Maternal Grandfather   . Hypertension Paternal Grandmother   . Stroke Paternal Grandmother   . COPD Neg Hx      HOME MEDICATIONS: Allergies as of 12/03/2019      Reactions   Cephalosporins Hives   Penicillins Hives      Medication List       Accurate as of December 03, 2019 12:41 PM. If you have any questions, ask your nurse or doctor.        acetaminophen 325 MG tablet Commonly known as: Tylenol Take 2 tablets (650 mg total) by mouth every 4 (four) hours as needed (for pain scale < 4).   ibuprofen 600 MG tablet Commonly known as: ADVIL Take 1 tablet (600 mg total) by mouth every 6 (six) hours.   levothyroxine 50 MCG tablet Commonly known as: SYNTHROID Take 1 tablet (50 mcg total) by mouth as directed. 10 tablets weekly  Norgestimate-Ethinyl Estradiol Triphasic 0.18/0.215/0.25 MG-25 MCG tab Take 1 tablet by mouth daily.   PRENATAL VITAMINS PO Take by mouth.   SUMAtriptan 50 MG tablet Commonly known as: Imitrex May repeat in 2 hours if headache persists (max 2 tabs/24 hrs)         OBJECTIVE:   PHYSICAL EXAM: VS: BP 104/78 (BP Location: Left Arm, Patient Position: Sitting, Cuff Size: Normal)   Pulse 81   Temp 98.7 F (37.1 C)   Ht 5\' 6"  (1.676 m)   Wt 148 lb 12.8 oz (67.5 kg)   SpO2 98%   BMI 24.02 kg/m    EXAM: General: Pt appears well and is in NAD  Neck: General: Supple without adenopathy. Thyroid: Thyroid size normal.  No goiter or nodules appreciated. No thyroid bruit.    Lungs: Clear with good BS bilat with no rales, rhonchi, or wheezes  Heart: Auscultation: RRR.  Abdomen: Normoactive bowel sounds, soft, nontender, without masses or organomegaly palpable  Extremities:  BL LE: No pretibial edema normal ROM and strength.  Mental Status: Judgment, insight: Intact Mood and affect: No depression, anxiety, or agitation     DATA REVIEWED:  Results for AMIA, DUPERE (MRN [id number]) as of 12/03/2019 12:41  Ref. Range 12/03/2019 09:37  TSH Latest Ref Range: 0.35 - 4.50 uIU/mL 1.28  T4,Free(Direct) Latest Ref Range: 0.60 - 1.60 ng/dL 0.89     ASSESSMENT / PLAN / RECOMMENDATIONS:   1. Hypothyroidism :   - Pt is clinically and biochemically euthyroid - She is compliant with LT-4 replacement.  - She denies any local neck symptoms    Medications   Continue Levothyroxine 50 mcg daily     F/U in 6 months   Signed electronically by: Mack Guise, MD  [facility]  [address] [place], [place] [address] Phone: [phone] FAX: [phone]      CC: Debbrah Alar, [address] [place] [address] Phone: [phone]  Fax: [phone]   Return to Endocrinology clinic as below: Future Appointments  Date Time Provider [place]  06/02/2020  8:50 AM Tray Klayman, Melanie Crazier, MD [id number] None

## 2020-02-14 DIAGNOSIS — D225 Melanocytic nevi of trunk: Secondary | ICD-10-CM | POA: Diagnosis not present

## 2020-02-14 DIAGNOSIS — L578 Other skin changes due to chronic exposure to nonionizing radiation: Secondary | ICD-10-CM | POA: Diagnosis not present

## 2020-02-14 DIAGNOSIS — D2271 Melanocytic nevi of right lower limb, including hip: Secondary | ICD-10-CM | POA: Diagnosis not present

## 2020-02-14 DIAGNOSIS — D2372 Other benign neoplasm of skin of left lower limb, including hip: Secondary | ICD-10-CM | POA: Diagnosis not present

## 2020-06-02 ENCOUNTER — Ambulatory Visit: Payer: BC Managed Care – PPO | Admitting: Internal Medicine

## 2020-06-04 ENCOUNTER — Encounter: Payer: Self-pay | Admitting: Internal Medicine

## 2020-06-04 ENCOUNTER — Other Ambulatory Visit: Payer: Self-pay

## 2020-06-04 ENCOUNTER — Ambulatory Visit: Payer: BC Managed Care – PPO | Admitting: Internal Medicine

## 2020-06-04 VITALS — BP 118/80 | HR 99 | Ht 66.0 in | Wt 137.2 lb

## 2020-06-04 DIAGNOSIS — E039 Hypothyroidism, unspecified: Secondary | ICD-10-CM

## 2020-06-04 LAB — TSH: TSH: 3.81 u[IU]/mL (ref 0.35–4.50)

## 2020-06-04 NOTE — Progress Notes (Signed)
Marland Kitchenlogoend   Name: Jenny Eaton  MRN/ DOB: [id number], [birth year]    Age/ Sex: 29 y.o., female     PCP: Debbrah Alar, NP   Reason for Endocrinology Evaluation: Hypothyroidism     Initial Endocrinology Clinic Visit: 01/03/2019    PATIENT IDENTIFIER: Jenny Eaton is a 29 y.o., female with a past medical history of migraine headaches and hypothyroidism. She has followed with [facility] since 01/03/2019 for consultative assistance with management of her hypothyroidism.   HISTORICAL SUMMARY: The patient was first diagnosed with hypothyroidism in 2006, she was in 8th grade at the time with hair loss, fatigue and dry eyes.   She has been on levothyroxine 50 mcg daily for a long time , she was also on OCP's but stopped them in October,2019 and started prenatals and that's when her TFT's became abnormal with a TSH of 0.34 uIU/mL. She was advised by her PCP to reduce the dose to 25 mcg with repeat TST of 6.54 uIU/mL.    S/P normal vaginal delivery 10/22/2019 [facility])  SUBJECTIVE:     Today (06/04/2020):  Jenny Eaton is here for a   follow up appointment on her hypothyroidism.     She has been noted with weight loss   Denies constipation No depression  No local    She is compliant with LT-4 replacement , she takes it appropriately.    ROS:  As per HPI.    HISTORY:  Past Medical History:  Past Medical History:  Diagnosis Date  . History of Hashimoto thyroiditis 2010  . Hypothyroidism   . Microscopic hematuria    negative work up 2019  . Migraines   . Thyroid disease    hypothyroid   Past Surgical History:  Past Surgical History:  Procedure Laterality Date  . excision of vaginal septum  2008  . WISDOM TOOTH EXTRACTION  2011    Social History:  reports that she has never smoked. She has never used smokeless tobacco. She reports previous alcohol use. She reports that she does not use drugs. Family History:  Family History  Problem  Relation Age of Onset  . Hyperlipidemia Mother   . Hashimoto's thyroiditis Maternal Grandmother   . Kidney cancer Maternal Grandmother   . Pulmonary embolism Maternal Grandmother   . Cancer Maternal Grandmother   . Diabetes Maternal Grandfather   . Heart disease Maternal Grandfather        CAD- living, ?hx of cabg  . Hypertension Maternal Grandfather   . Hypertension Paternal Grandmother   . Stroke Paternal Grandmother   . COPD Neg Hx      HOME MEDICATIONS: Allergies as of 06/04/2020      Reactions   Cephalosporins Hives   Penicillins Hives      Medication List       Accurate as of June 04, 2020  7:13 AM. If you have any questions, ask your nurse or doctor.        acetaminophen 325 MG tablet Commonly known as: Tylenol Take 2 tablets (650 mg total) by mouth every 4 (four) hours as needed (for pain scale < 4).   ibuprofen 600 MG tablet Commonly known as: ADVIL Take 1 tablet (600 mg total) by mouth every 6 (six) hours.   levothyroxine 50 MCG tablet Commonly known as: SYNTHROID Take 1 tablet (50 mcg total) by mouth daily before breakfast. 10 tablets weekly   Norgestimate-Ethinyl Estradiol Triphasic 0.18/0.215/0.25 MG-25 MCG tab Take 1 tablet by mouth daily.   PRENATAL  VITAMINS PO Take by mouth.   SUMAtriptan 50 MG tablet Commonly known as: Imitrex May repeat in 2 hours if headache persists (max 2 tabs/24 hrs)         OBJECTIVE:   PHYSICAL EXAM: VS: There were no vitals taken for this visit.   EXAM: General: Pt appears well and is in NAD  Neck: General: Supple without adenopathy. Thyroid: Thyroid size normal.  No goiter or nodules appreciated. No thyroid bruit.  Lungs: Clear with good BS bilat with no rales, rhonchi, or wheezes  Heart: Auscultation: RRR.  Abdomen: Normoactive bowel sounds, soft, nontender, without masses or organomegaly palpable  Extremities:  BL LE: No pretibial edema normal ROM and strength.  Mental Status: Judgment, insight:  Intact Mood and affect: No depression, anxiety, or agitation     DATA REVIEWED:  Results for BELVIE, IRIBE (MRN [id number]) as of 06/06/2020 11:19  Ref. Range 06/04/2020 11:08  TSH Latest Ref Range: 0.35 - 4.50 uIU/mL 3.81      ASSESSMENT / PLAN / RECOMMENDATIONS:   1. Hypothyroidism :   - Pt is clinically and biochemically euthyroid - She is compliant with LT-4 replacement.  - She denies any local neck symptoms    Medications   Continue Levothyroxine 50 mcg daily     F/U in 1 yr     Signed electronically by: Mack Guise, MD  [facility]  [facility] [address] [address], [place] [address] Phone: [phone] FAX: [phone]      CC: Debbrah Alar, [address] [place] [address] Phone: [phone]  Fax: [phone]   Return to Endocrinology clinic as below: Future Appointments  Date Time Provider [place]  06/04/2020 10:30 AM Braylen Denunzio, Melanie Crazier, MD [id number]

## 2020-06-04 NOTE — Patient Instructions (Signed)

## 2020-06-06 MED ORDER — LEVOTHYROXINE SODIUM 50 MCG PO TABS: 50.0000 ug | ORAL_TABLET | Freq: Every day | ORAL | 3 refills | Status: DC

## 2020-06-11 ENCOUNTER — Other Ambulatory Visit: Payer: Self-pay

## 2020-06-11 MED ORDER — LEVOTHYROXINE SODIUM 50 MCG PO TABS: 50.0000 ug | ORAL_TABLET | Freq: Every day | ORAL | 0 refills | Status: DC

## 2020-09-09 ENCOUNTER — Other Ambulatory Visit: Payer: Self-pay | Admitting: Internal Medicine

## 2020-09-24 ENCOUNTER — Encounter: Payer: Self-pay | Admitting: Family

## 2020-09-25 NOTE — Telephone Encounter (Signed)
Called patient to offer her an appointment with another provider, she reports she is feeling somewhat better. She will wait until tomorrow in am  and call back if she feels she needs appointment tomorrow.

## 2020-10-13 ENCOUNTER — Other Ambulatory Visit: Payer: Self-pay

## 2020-10-13 DIAGNOSIS — Z3009 Encounter for other general counseling and advice on contraception: Secondary | ICD-10-CM

## 2020-10-13 MED ORDER — NORGESTIM-ETH ESTRAD TRIPHASIC 0.18/0.215/0.25 MG-25 MCG PO TABS: 1.0000 | ORAL_TABLET | Freq: Every day | ORAL | 12 refills | Status: DC

## 2020-10-13 NOTE — Progress Notes (Signed)
Patient sent prescription refill via fax. Armandina Stammer RN

## 2021-01-16 IMAGING — US US MFM OB FOLLOW UP
1 series · 13 of 28 positions shown · non-contrast
Comparison: none

[Series 1: us mfm ob follow up · 13 of 84 slices shown]
[im 4/84]
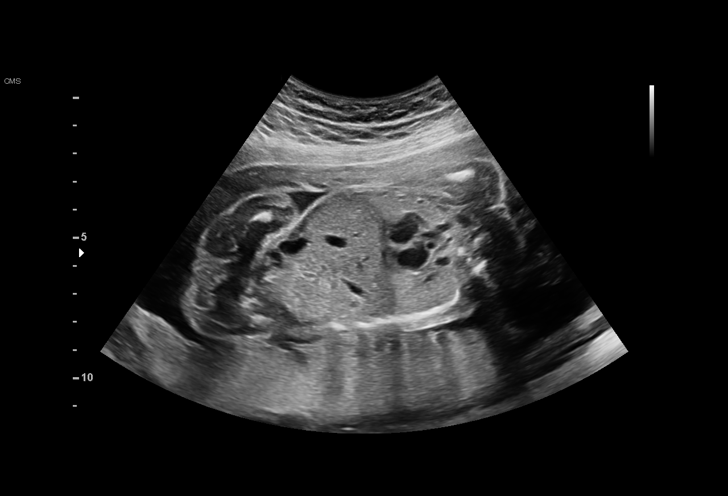
[im 10/84]
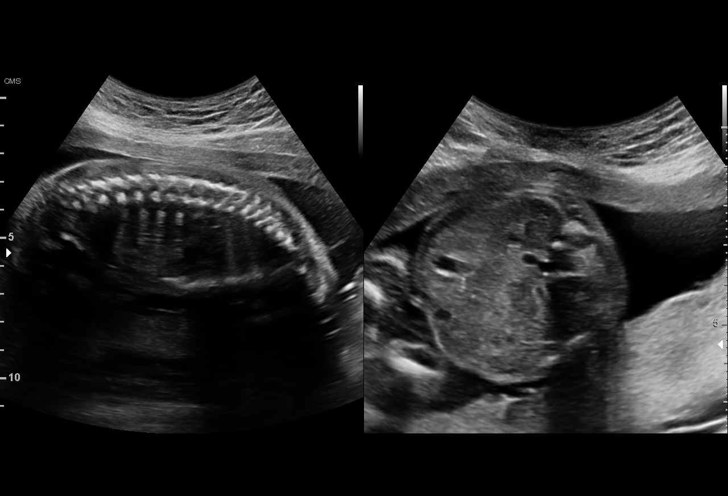
[im 16/84]
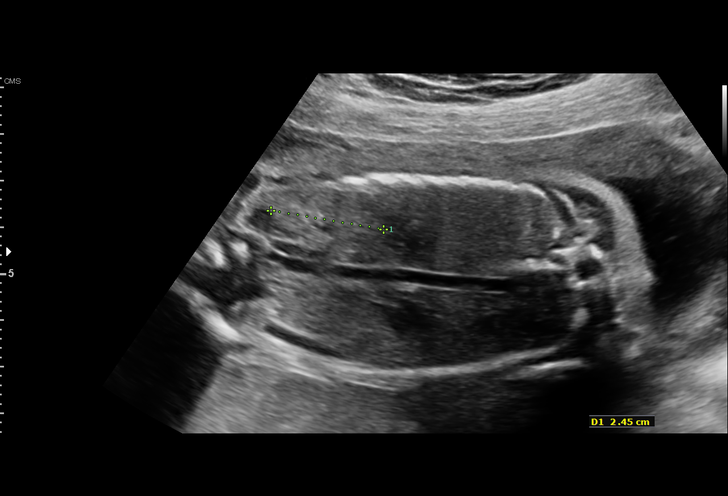
[im 22/84]
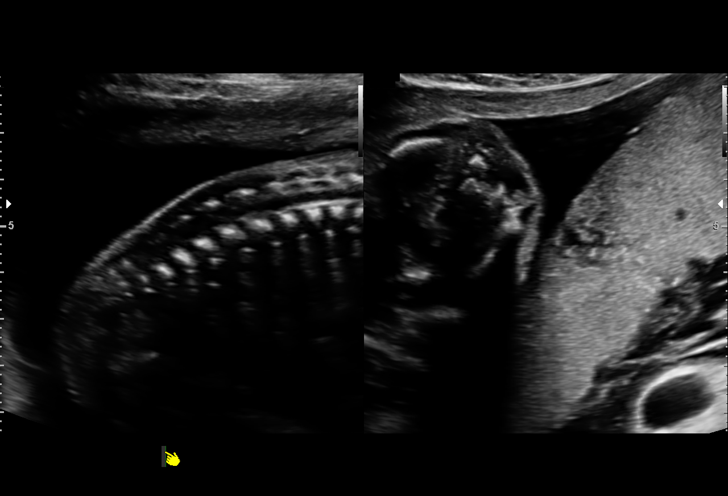
[im 28/84]
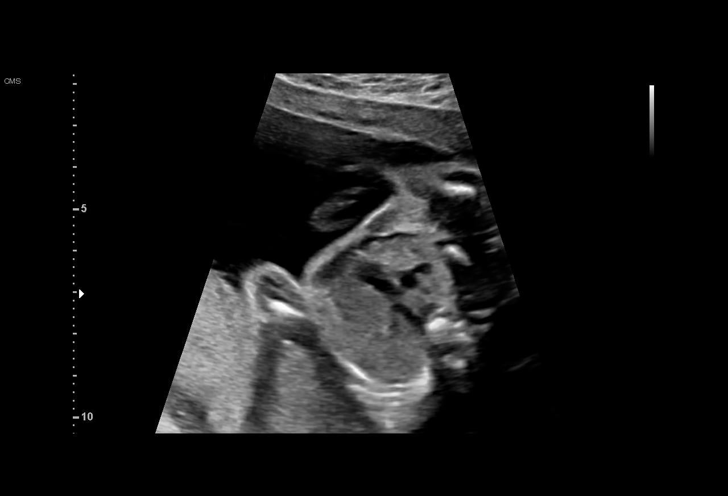
[im 34/84]
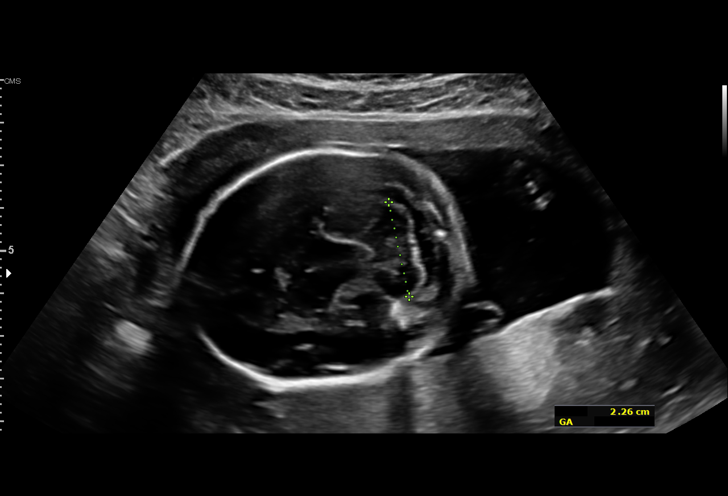
[im 44/84]
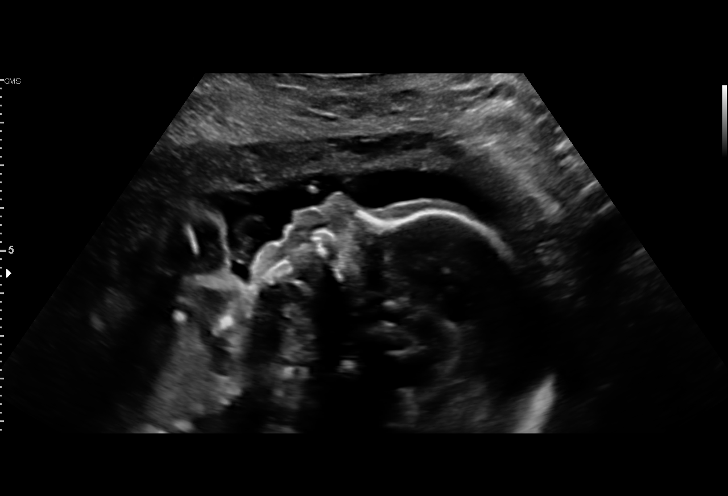
[im 50/84]
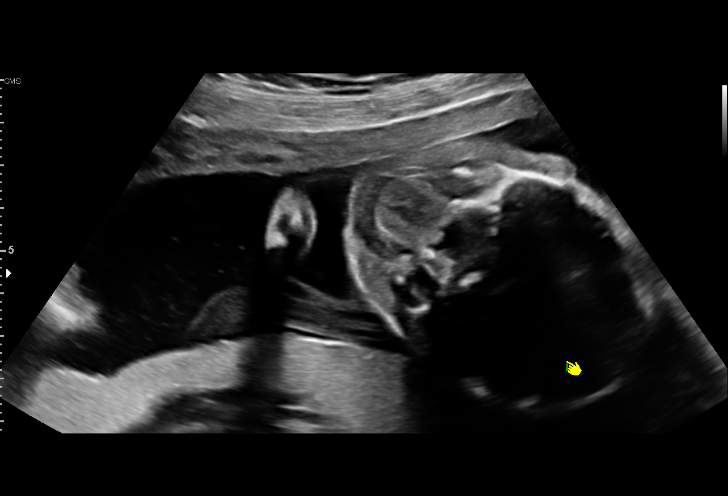
[im 56/84]
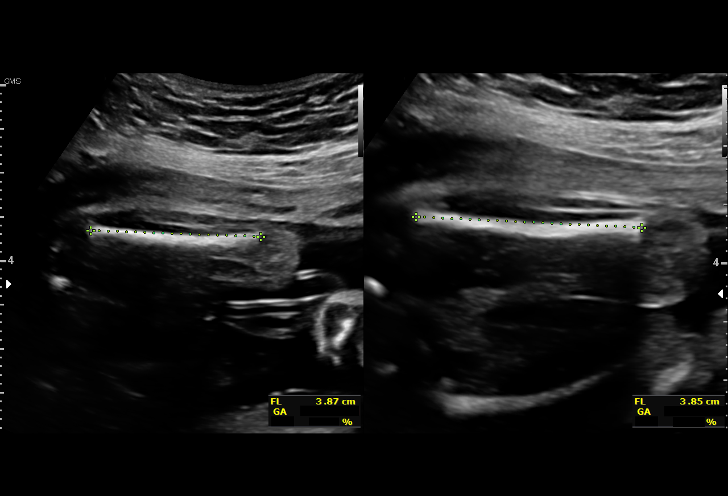
[im 62/84]
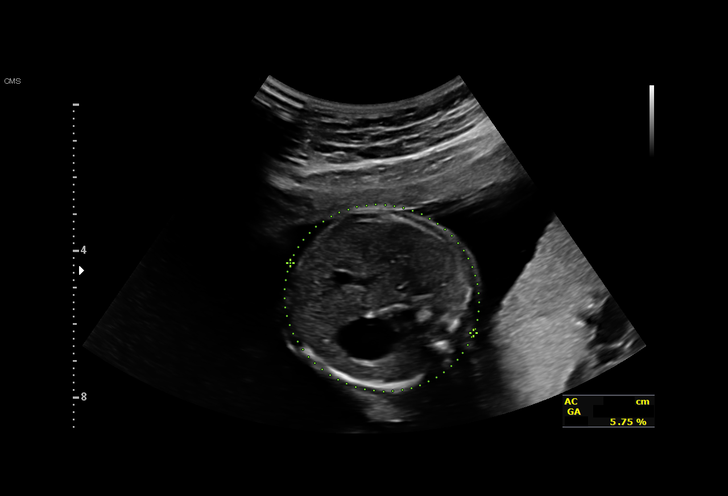
[im 68/84]
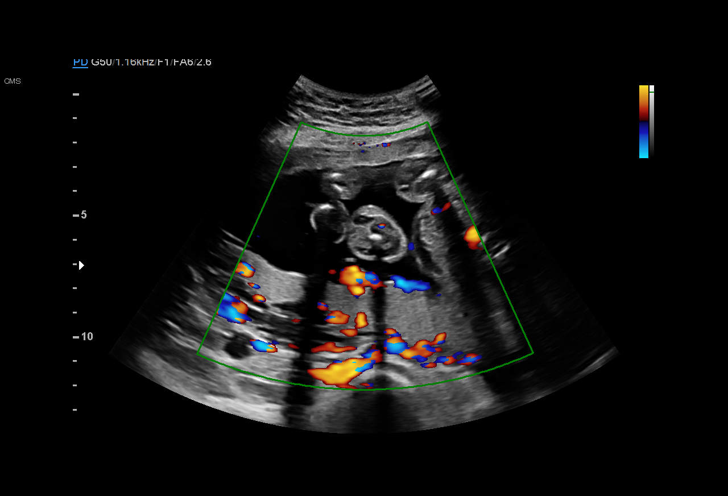
[im 74/84]
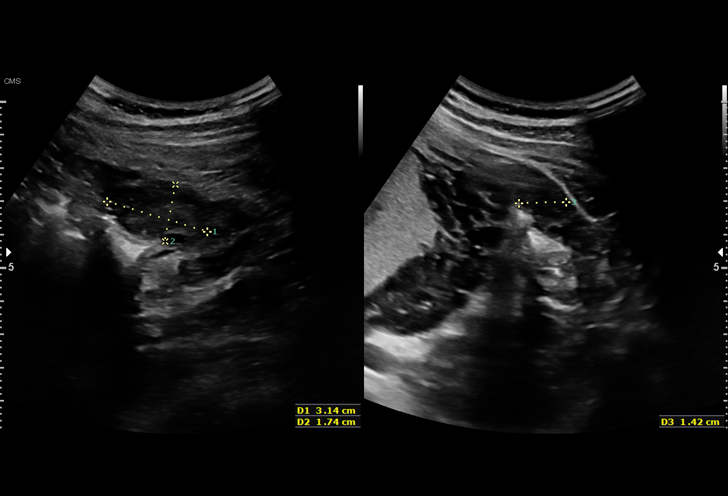
[im 80/84]
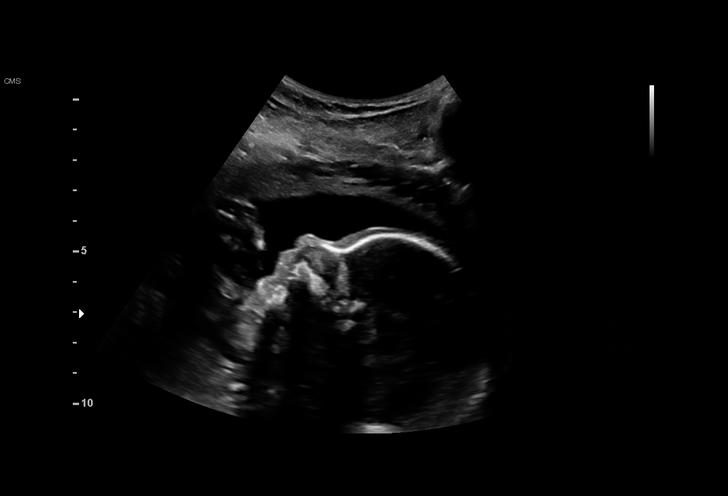

[13 of 28 positions shown; findings below may reference images not displayed]

[REDACTED]
                   PESQUEIRA CNM

                                                       LABANC
 ----------------------------------------------------------------------

 ----------------------------------------------------------------------
Indications

  Antenatal follow-up for nonvisualized fetal
  anatomy
  Hypothyroid
  23 weeks gestation of pregnancy
 ----------------------------------------------------------------------
Vital Signs

                                                Height:        5'6"
Fetal Evaluation

 Num Of Fetuses:         1
 Fetal Heart Rate(bpm):  138
 Cardiac Activity:       Observed
 Presentation:           Cephalic
 Placenta:               Posterior
 P. Cord Insertion:      Visualized

 Amniotic Fluid
 AFI FV:      Within normal limits

                             Largest Pocket(cm)

OB History

 Gravidity:    1         Term:   0        Prem:   0        SAB:   0
 TOP:          0       Ectopic:  0        Living: 0
Gestational Age

 LMP:           23w 0d        Date:  01/08/19                 EDD:   10/15/19
 Best:          23w 0d     Det. By:  LMP  (01/08/19)          EDD:   10/15/19
Anatomy

 Cranium:               Appears normal         LVOT:                   Appears normal
 Cavum:                 Appears normal         Aortic Arch:            Appears normal
 Ventricles:            Appears normal         Ductal Arch:            Previously seen
 Choroid Plexus:        Appears normal         Diaphragm:              Appears normal
 Cerebellum:            Appears normal         Stomach:                Appears normal, left
                                                                       sided
 Posterior Fossa:       Appears normal         Abdomen:                Appears normal
 Nuchal Fold:           Previously seen        Abdominal Wall:         Appears nml (cord
                                                                       insert, abd wall)
 Face:                  Appears normal         Cord Vessels:           Appears normal (3
                        (orbits and profile)                           vessel cord)
 Lips:                  Appears normal         Kidneys:                Appear normal
 Palate:                Appears normal         Bladder:                Appears normal
 Thoracic:              Appears normal         Spine:                  Appears normal
 Heart:                 Echogenic focus        Upper Extremities:      Previously seen
                        in LV and RV
 RVOT:                  Appears normal         Lower Extremities:      Previously seen

 Other:  Heels and 5th digit previously visualized. Nasal bone visualized.
Cervix Uterus Adnexa

 Cervix
 Normal appearance by transabdominal scan.

 Uterus
 No abnormality visualized.

 Left Ovary
 Within normal limits.

 Right Ovary
 Within normal limits.

 Cul De Sac
 No free fluid seen.

 Adnexa
 No abnormality visualized.
Comments

 echogenic focus
Impression

 Patient returned for completion of fetal anatomy. Amniotic
 fluid is normal and good fetal activity is seen. Fetal biometry
 is consistent with her previously-established dates. Fetal
 spine appears normal. Bilateral echogenic intracardiac foci
 are seen. No other markers of fetal aneuploidies are seen.
 Fetal anatomical survey was completed and appears normal.

 I informed the patient that given that she had Rugu Jaf for fetal
 Down syndrome on tetra screen, the finding of echogenic
 intracardiac focus should be considered a normal variant and
 that the risk of trisomy 21 is not increased. I also reassured
 that echogenic focus does not increase the risk of cardiac
 defects. I also informed her that only amniocentesis will give
 a defintive result on the fetal karyotype.
 We also discussed cell-free fetal DNA screening that has a
 greater detection rate than tetra screening. Patient requested
 cell-free fetal DNA screening. Blood was drawn today for cell-
 free fetal DNA screening. We will communicate the results to
 the patient and scan a copy into [REDACTED].
Recommendations

 Follow-up scans as clinically indicated.
                 Resoagli, Franciso

## 2021-03-02 ENCOUNTER — Encounter: Payer: Self-pay | Admitting: Family

## 2021-03-02 ENCOUNTER — Other Ambulatory Visit: Payer: Self-pay

## 2021-03-02 ENCOUNTER — Ambulatory Visit (INDEPENDENT_AMBULATORY_CARE_PROVIDER_SITE_OTHER): Payer: BC Managed Care – PPO | Admitting: Family

## 2021-03-02 VITALS — BP 117/83 | HR 77 | Temp 98.5°F | Resp 16 | Ht 65.5 in | Wt 128.2 lb

## 2021-03-02 DIAGNOSIS — B354 Tinea corporis: Secondary | ICD-10-CM | POA: Diagnosis not present

## 2021-03-02 DIAGNOSIS — Z0000 Encounter for general adult medical examination without abnormal findings: Secondary | ICD-10-CM | POA: Diagnosis not present

## 2021-03-02 MED ORDER — FLUCONAZOLE 150 MG PO TABS: 150.0000 mg | ORAL_TABLET | ORAL | 0 refills | Status: DC

## 2021-03-02 NOTE — Assessment & Plan Note (Signed)
New. Will rx with diflucan 150mg  once weekly for 3 weeks. She has an upcoming appointment with dermatology as well.

## 2021-03-02 NOTE — Assessment & Plan Note (Signed)
Immunizations reviewed and up to date. Encouraged her to continue healthy diet, exercise and weight loss.

## 2021-03-02 NOTE — Progress Notes (Signed)
Subjective:   By signing my name below, I, Shehryar Baig, attest that this documentation has been prepared under the direction and in the presence of Debbrah Alar NP. 03/02/2021      Patient ID: Jenny Eaton, female    DOB: [birth year], 30 y.o.   MRN: [id number]  No chief complaint on file.   HPI Patient is in today for a comprehensive physical exam. She complains of of a lingering cough for the past 3 weeks. She was sick 3 weeks ago but has mostly recovered. She also complains of having a skin rash on her lower back. She is having that issues managed during her upcoming dermatologist appointment. She denies having any fever, burning, frequency, abnormal menstrual cycle, muscle pain, joint pain, headaches, depression, anxiety, diarrhea, and constipation at this time. She has had a baby on October 22, 2019. She has had no surgeries since her last visit. She has had no changes in her family history since her last visit.   Immunizations: She is UTD on tetanus vaccination. She is UTD on er Covid 19 vaccinations. She has had a flu vaccine on October 30, 2020.  Diet: She is managing a healthy diet. Exercise: She participates in exercise by walking at work and occasionally walking around her neighborhood. Colonoscopy: Not yet completed. Dexa: Not yet completed. Pap Smear: Last completed 03/22/2019. Results normal. Repeat in 3 years. Mammogram: Not yet completed. Dental: Vision: She is planning to make a vision appointment.   Past Medical History:  Diagnosis Date  . History of Hashimoto thyroiditis 2010  . Hypothyroidism   . Microscopic hematuria    negative work up 2019  . Migraines   . Thyroid disease    hypothyroid    Past Surgical History:  Procedure Laterality Date  . excision of vaginal septum  2008  . WISDOM TOOTH EXTRACTION  2011    Family History  Problem Relation Age of Onset  . Hyperlipidemia Mother   . Hashimoto's thyroiditis Maternal Grandmother   .  Kidney cancer Maternal Grandmother   . Pulmonary embolism Maternal Grandmother   . Cancer Maternal Grandmother   . Diabetes Maternal Grandfather   . Heart disease Maternal Grandfather        CAD- living, ?hx of cabg  . Hypertension Maternal Grandfather   . Hypertension Paternal Grandmother   . Stroke Paternal Grandmother   . COPD Neg Hx     Social History   Socioeconomic History  . Marital status: Married    Spouse name: Adreanne Yono  . Number of children: Not on file  . Years of education: Not on file  . Highest education level: Not on file  Occupational History  . Not on file  Tobacco Use  . Smoking status: Never Smoker  . Smokeless tobacco: Never Used  Vaping Use  . Vaping Use: Never used  Substance and Sexual Activity  . Alcohol use: Not Currently  . Drug use: Never  . Sexual activity: Yes    Birth control/protection: None  Other Topics Concern  . 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 acetaminophen (TYLENOL) 325 MG tablet Take 2 tablets (650 mg total) by mouth every 4 (four) hours as needed (for pain scale < 4).    . ibuprofen (ADVIL) 600 MG tablet Take 1 tablet (600 mg total) by mouth every 6 (six) hours. 30 tablet 0  . levothyroxine (SYNTHROID) 50 MCG tablet Take 1 tablet (50 mcg total) by mouth daily before breakfast. 90 tablet 1  . Norgestimate-Ethinyl Estradiol Triphasic 0.18/0.215/0.25  MG-25 MCG tab Take 1 tablet by mouth daily. 30 tablet 12  . Prenatal Vit-Fe Fumarate-FA (PRENATAL VITAMINS PO) Take by mouth.    . SUMAtriptan (IMITREX) 50 MG tablet May repeat in 2 hours if headache persists (max 2 tabs/24 hrs) 30 tablet 1   No facility-administered medications prior to visit.    Allergies  Allergen Reactions  . Cephalosporins Hives  . Penicillins Hives    Review of Systems  Constitutional: Negative for fever.  Respiratory: Positive for cough (lingering cough).   Gastrointestinal: Negative for constipation and diarrhea.  Genitourinary: Negative for dysuria and frequency.  Musculoskeletal: Negative for joint pain and myalgias.  Skin: Positive for rash (Upper back).  Psychiatric/Behavioral: Negative for depression. The patient is not nervous/anxious.        Objective:    Physical Exam Constitutional:      Appearance: Normal appearance.  HENT:     Head: Normocephalic and atraumatic.     Right Ear: Tympanic membrane and external ear normal.     Left Ear: Tympanic membrane and external ear normal.  Eyes:     Extraocular Movements: Extraocular movements intact.     Pupils: Pupils are equal, round, and reactive to light.     Comments: No nystagmus.  Cardiovascular:     Rate and Rhythm: Normal rate and regular rhythm.     Pulses: Normal pulses.     Heart sounds: Normal heart sounds. No murmur heard. No friction rub. No gallop.   Pulmonary:     Effort: Pulmonary effort is normal. No respiratory distress.     Breath sounds: Normal breath sounds. No stridor. No wheezing, rhonchi or rales.  Chest:  Breasts:     Right: Normal.     Left: Normal.    Abdominal:     General: Bowel sounds are normal. There is no distension.     Palpations: Abdomen is soft. There is no mass.     Tenderness: There is no abdominal tenderness. There is no guarding or rebound.     Hernia: No hernia is present.  Musculoskeletal:     Comments: 5/5 strength in both upper and lower  extremities.  Lymphadenopathy:     Cervical: No cervical adenopathy.  Skin:    General: Skin is warm and dry.     Comments: Hyperpigmented anular skin rash noted on lower back.  Neurological:     Mental Status: She is alert and oriented to person, place, and time.     Comments: Patellar reflexes normal.  Psychiatric:        Behavior: Behavior normal.     There were no vitals taken for this visit. Wt Readings from Last 3 Encounters:  06/04/20 137 lb 3.2 oz (62.2 kg)  12/03/19 148 lb 12.8 oz (67.5 kg)  11/19/19 148 lb (67.1 kg)  Diabetic Foot Exam - Simple   No data filed    Lab Results  Component Value Date   WBC 8.3 10/22/2019   HGB 12.6 10/22/2019   HCT 37.5 10/22/2019   PLT 162 10/22/2019   GLUCOSE 92 02/06/2018   CHOL 168 02/06/2018   TRIG 87.0 02/06/2018   HDL 76.60 02/06/2018   LDLCALC 74 02/06/2018   ALT 9 02/06/2018   AST 15 02/06/2018   NA 137 02/06/2018   K 4.0 02/06/2018   CL 104 02/06/2018   CREATININE 0.69 02/06/2018   BUN 12 02/06/2018   CO2 28 02/06/2018   TSH 3.81 06/04/2020    Lab Results  Component Value Date   TSH 3.81 06/04/2020   Lab Results  Component Value Date   WBC 8.3 10/22/2019   HGB 12.6 10/22/2019   HCT 37.5 10/22/2019   MCV 94.5 10/22/2019   PLT 162 10/22/2019   Lab Results  Component Value Date   NA 137 02/06/2018   K 4.0 02/06/2018   CO2 28 02/06/2018   GLUCOSE 92 02/06/2018   BUN 12 02/06/2018   CREATININE 0.69 02/06/2018   BILITOT 0.2 02/06/2018   ALKPHOS 34 (L) 02/06/2018   AST 15 02/06/2018   ALT 9 02/06/2018   PROT 6.8 02/06/2018   ALBUMIN 4.1 02/06/2018   CALCIUM 9.3 02/06/2018   GFR 107.60 02/06/2018   Lab Results  Component Value Date   CHOL 168 02/06/2018   Lab Results  Component Value Date   HDL 76.60 02/06/2018   Lab Results  Component Value Date   LDLCALC 74 02/06/2018   Lab Results  Component Value Date   TRIG 87.0 02/06/2018   Lab Results  Component Value Date   CHOLHDL 2  02/06/2018   No results found for: HGBA1C     Assessment & Plan:   Problem List Items Addressed This Visit   None      No orders of the defined types were placed in this encounter.   I, Debbrah Alar NP, personally preformed the services described in this documentation.  All medical record entries made by the scribe were at my direction and in my presence.  I have reviewed the chart and discharge instructions (if applicable) and agree that the record reflects my personal performance and is accurate and complete. 03/02/2021   I,Shehryar Baig,acting as a Education administrator for Nance Pear, NP.,have documented all relevant documentation on the behalf of Nance Pear, NP,as directed by  Nance Pear, NP while in the presence of Nance Pear, NP.   Shehryar [employer]

## 2021-03-02 NOTE — Patient Instructions (Signed)
Preventive Care 21-31 Years Old, Female Preventive care refers to lifestyle choices and visits with your health care provider that can promote health and wellness. This includes:  A yearly physical exam. This is also called an annual wellness visit.  Regular dental and eye exams.  Immunizations.  Screening for certain conditions.  Healthy lifestyle choices, such as: ? Eating a healthy diet. ? Getting regular exercise. ? Not using drugs or products that contain nicotine and tobacco. ? Limiting alcohol use. What can I expect for my preventive care visit? Physical exam Your health care provider may check your:  Height and weight. These may be used to calculate your BMI (body mass index). BMI is a measurement that tells if you are at a healthy weight.  Heart rate and blood pressure.  Body temperature.  Skin for abnormal spots. Counseling Your health care provider may ask you questions about your:  Past medical problems.  Family's medical history.  Alcohol, tobacco, and drug use.  Emotional well-being.  Home life and relationship well-being.  Sexual activity.  Diet, exercise, and sleep habits.  Work and work environment.  Access to firearms.  Method of birth control.  Menstrual cycle.  Pregnancy history. What immunizations do I need? Vaccines are usually given at various ages, according to a schedule. Your health care provider will recommend vaccines for you based on your age, medical history, and lifestyle or other factors, such as travel or where you work.   What tests do I need? Blood tests  Lipid and cholesterol levels. These may be checked every 5 years starting at age 20.  Hepatitis C test.  Hepatitis B test. Screening  Diabetes screening. This is done by checking your blood sugar (glucose) after you have not eaten for a while (fasting).  STD (sexually transmitted disease) testing, if you are at risk.  BRCA-related cancer screening. This may be  done if you have a family history of breast, ovarian, tubal, or peritoneal cancers.  Pelvic exam and Pap test. This may be done every 3 years starting at age 21. Starting at age 30, this may be done every 5 years if you have a Pap test in combination with an HPV test. Talk with your health care provider about your test results, treatment options, and if necessary, the need for more tests.   Follow these instructions at home: Eating and drinking  Eat a healthy diet that includes fresh fruits and vegetables, whole grains, lean protein, and low-fat dairy products.  Take vitamin and mineral supplements as recommended by your health care provider.  Do not drink alcohol if: ? Your health care provider tells you not to drink. ? You are pregnant, may be pregnant, or are planning to become pregnant.  If you drink alcohol: ? Limit how much you have to 0-1 drink a day. ? Be aware of how much alcohol is in your drink. In the [place] one drink equals one 12 oz bottle of beer (355 mL), one 5 oz glass of wine (148 mL), or one 1 oz glass of hard liquor (44 mL).   Lifestyle  Take daily care of your teeth and gums. Brush your teeth every morning and night with fluoride toothpaste. Floss one time each day.  Stay active. Exercise for at least 30 minutes 5 or more days each week.  Do not use any products that contain nicotine or tobacco, such as cigarettes, e-cigarettes, and chewing tobacco. If you need help quitting, ask your health care provider.  Do not   use drugs.  If you are sexually active, practice safe sex. Use a condom or other form of protection to prevent STIs (sexually transmitted infections).  If you do not wish to become pregnant, use a form of birth control. If you plan to become pregnant, see your health care provider for a prepregnancy visit.  Find healthy ways to cope with stress, such as: ? Meditation, yoga, or listening to music. ? Journaling. ? Talking to a trusted  person. ? Spending time with friends and family. Safety  Always wear your seat belt while driving or riding in a vehicle.  Do not drive: ? If you have been drinking alcohol. Do not ride with someone who has been drinking. ? When you are tired or distracted. ? While texting.  Wear a helmet and other protective equipment during sports activities.  If you have firearms in your house, make sure you follow all gun safety procedures.  Seek help if you have been physically or sexually abused. What's next?  Go to your health care provider once a year for an annual wellness visit.  Ask your health care provider how often you should have your eyes and teeth checked.  Stay up to date on all vaccines. This information is not intended to replace advice given to you by your health care provider. Make sure you discuss any questions you have with your health care provider. Document Revised: 06/01/2020 Document Reviewed: 06/15/2018 Elsevier Patient Education  2021 Elsevier Inc.  

## 2021-03-09 ENCOUNTER — Other Ambulatory Visit: Payer: Self-pay | Admitting: Internal Medicine

## 2021-04-06 DIAGNOSIS — L814 Other melanin hyperpigmentation: Secondary | ICD-10-CM | POA: Diagnosis not present

## 2021-04-06 DIAGNOSIS — D1801 Hemangioma of skin and subcutaneous tissue: Secondary | ICD-10-CM | POA: Diagnosis not present

## 2021-04-06 DIAGNOSIS — L578 Other skin changes due to chronic exposure to nonionizing radiation: Secondary | ICD-10-CM | POA: Diagnosis not present

## 2021-06-01 ENCOUNTER — Other Ambulatory Visit: Payer: Self-pay

## 2021-06-01 ENCOUNTER — Other Ambulatory Visit (HOSPITAL_COMMUNITY)
Admission: RE | Admit: 2021-06-01 | Discharge: 2021-06-01 | Disposition: A | Discharge status: Home / Self Care | Payer: BC Managed Care – PPO | Source: Ambulatory Visit | Attending: Obstetrics and Gynecology | Admitting: Obstetrics and Gynecology

## 2021-06-01 ENCOUNTER — Ambulatory Visit (INDEPENDENT_AMBULATORY_CARE_PROVIDER_SITE_OTHER): Payer: BC Managed Care – PPO

## 2021-06-01 VITALS — BP 132/85 | HR 84

## 2021-06-01 DIAGNOSIS — N898 Other specified noninflammatory disorders of vagina: Secondary | ICD-10-CM

## 2021-06-01 MED ORDER — FLUCONAZOLE 150 MG PO TABS: 150.0000 mg | ORAL_TABLET | Freq: Once | ORAL | 1 refills | Status: AC

## 2021-06-01 NOTE — Progress Notes (Signed)
Patient has had thick clumpy white discharge and itching for 2 days. Kathrene Alu RN

## 2021-06-01 NOTE — Progress Notes (Signed)
Chart reviewed - agree with CMA/RN documentation.  ° °

## 2021-06-02 ENCOUNTER — Other Ambulatory Visit: Payer: Self-pay | Admitting: Family Medicine

## 2021-06-02 LAB — CERVICOVAGINAL ANCILLARY ONLY
Bacterial Vaginitis (gardnerella): NEGATIVE
Candida Glabrata: NEGATIVE
Candida Vaginitis: POSITIVE — AB
Comment: NEGATIVE
Comment: NEGATIVE
Comment: NEGATIVE

## 2021-06-02 MED ORDER — FLUCONAZOLE 150 MG PO TABS: 150.0000 mg | ORAL_TABLET | Freq: Once | ORAL | 0 refills | Status: AC

## 2021-06-15 ENCOUNTER — Encounter: Payer: Self-pay | Admitting: Family

## 2021-06-15 NOTE — Telephone Encounter (Signed)
Was scheduled to come in tomorrow

## 2021-06-16 ENCOUNTER — Other Ambulatory Visit: Payer: Self-pay

## 2021-06-16 ENCOUNTER — Ambulatory Visit: Payer: BC Managed Care – PPO | Admitting: Family

## 2021-06-16 VITALS — BP 117/87 | HR 80 | Temp 98.3°F | Resp 16 | Ht 65.5 in | Wt 128.0 lb

## 2021-06-16 DIAGNOSIS — J40 Bronchitis, not specified as acute or chronic: Secondary | ICD-10-CM | POA: Diagnosis not present

## 2021-06-16 MED ORDER — AZITHROMYCIN 250 MG PO TABS: ORAL_TABLET | ORAL | 0 refills | Status: AC

## 2021-06-16 MED ORDER — BENZONATATE 100 MG PO CAPS: 100.0000 mg | ORAL_CAPSULE | Freq: Three times a day (TID) | ORAL | 0 refills | Status: DC | PRN

## 2021-06-16 NOTE — Patient Instructions (Signed)
Start azithromycin (antibiotic) and tessalon (as needed for cough). Call if symptoms worsen or if symptoms are not improved in 1 week.

## 2021-06-16 NOTE — Progress Notes (Signed)
Subjective:     Patient ID: Jenny Eaton, female    DOB: 03/28/1991, 30 y.o.   MRN: [id number]  Chief Complaint  Patient presents with   Cough    Patient complains of productive cough on and off for monts    Cough  Patient is in today to discuss cough.   Had Covid back in October.  She notes yellow phlegm intermittently.  Notes that she has a coarse cough.  This current cough has been present x 2 weeks. Denies nasal drainage.  She has tested negative at home several times.  Her daughter is in daycare and frequently sick.  Last week she had a low grade fever and now has a mild cough.   Health Maintenance Due  Topic Date Due   Hepatitis C Screening  Never done   INFLUENZA VACCINE  05/18/2021    Past Medical History:  Diagnosis Date   History of Hashimoto thyroiditis 2010   Hypothyroidism    Microscopic hematuria    negative work up 2019   Migraines    Thyroid disease    hypothyroid    Past Surgical History:  Procedure Laterality Date   excision of vaginal septum  2008   WISDOM TOOTH EXTRACTION  2011    Family History  Problem Relation Age of Onset   Hyperlipidemia Mother    Hashimoto's thyroiditis Maternal Grandmother    Kidney cancer Maternal Grandmother    Pulmonary embolism Maternal Grandmother    Cancer Maternal Grandmother    Diabetes Maternal Grandfather    Heart disease Maternal Grandfather        CAD- living, ?hx of cabg   Hypertension Maternal Grandfather    Hypertension Paternal Grandmother    Stroke Paternal Grandmother    COPD Neg Hx     Social History   Socioeconomic History   Marital status: Married    Spouse name: Jaretsy Ledbetter   Number of children: Not on file   Years of education: Not on file   Highest education level: Not on file  Occupational History   Not on file  Tobacco Use   Smoking status: Never   Smokeless tobacco: Never  Vaping Use   Vaping Use: Never used  Substance and Sexual Activity   Alcohol use: Not Currently    Drug use: Never   Sexual activity: Yes    Birth control/protection: None  Other Topics Concern   Not on file  Social History Narrative   Married   Daughter Darnelle Bos- born 10/22/2019   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     ibuprofen (ADVIL) 600 MG tablet Take 1 tablet (600 mg total) by mouth every 6 (six) hours. 30 tablet 0   levothyroxine (SYNTHROID) 50 MCG tablet TAKE 1 TABLET DAILY BEFORE BREAKFAST 90 tablet 3  Norgestimate-Ethinyl Estradiol Triphasic 0.18/0.215/0.25 MG-25 MCG tab Take 1 tablet by mouth daily. 30 tablet 12   SUMAtriptan (IMITREX) 50 MG tablet May repeat in 2 hours if headache persists (max 2 tabs/24 hrs) 30 tablet 1   Prenatal Vit-Fe Fumarate-FA (PRENATAL VITAMINS PO) Take by mouth.     No facility-administered medications prior to visit.    Allergies  Allergen Reactions   Cephalosporins Hives   Penicillins Hives    Review of Systems  Respiratory:  Positive for cough.       Objective:    Physical Exam Constitutional:      Appearance: She is well-developed.  HENT:     Right Ear: Tympanic membrane and ear canal normal.     Left Ear: Tympanic membrane normal.  Cardiovascular:     Rate and Rhythm: Normal rate and regular rhythm.     Heart sounds: Normal heart sounds. No murmur heard. Pulmonary:     Effort: Pulmonary  effort is normal. No respiratory distress.     Breath sounds: Normal breath sounds. No wheezing.  Musculoskeletal:     Cervical back: Normal range of motion and neck supple.  Skin:    General: Skin is warm and dry.  Neurological:     Mental Status: She is alert.  Psychiatric:        Behavior: Behavior normal.        Thought Content: Thought content normal.        Judgment: Judgment normal.    BP 117/87 (BP Location: Right Arm, Patient Position: Sitting, Cuff Size: Small)   Pulse 80   Temp 98.3 F (36.8 C) (Oral)   Resp 16   Ht 5' 5.5" (1.664 m)   Wt 128 lb (58.1 kg)   SpO2 100%   BMI 20.98 kg/m  Wt Readings from Last 3 Encounters:  06/16/21 128 lb (58.1 kg)  03/02/21 128 lb 3.2 oz (58.2 kg)  06/04/20 137 lb 3.2 oz (62.2 kg)       Assessment & Plan:   Problem List Items Addressed This Visit       Unprioritized   Bronchitis - Primary    New. Given duration of symptoms and productive nature of cough, will plan rx with azithromycin (Zpak) and tessalon prn. Pt is advised to call if symptoms worsen or if they fail to improve.        I have discontinued Lawerance Cruel "[name]"'s Prenatal Vit-Fe Fumarate-FA (PRENATAL VITAMINS PO). I am also having her start on azithromycin and benzonatate. Additionally, I am having her maintain her SUMAtriptan, acetaminophen, ibuprofen, Norgestimate-Ethinyl Estradiol Triphasic, and levothyroxine.  Meds ordered this encounter  Medications   azithromycin (ZITHROMAX) 250 MG tablet    Sig: Take 2 tablets on day 1, then 1 tablet daily on days 2 through 5    Dispense:  6 tablet    Refill:  0    Order Specific Question:   Supervising Provider    Answer:   Penni Homans A [[id number]]   benzonatate (TESSALON) 100 MG capsule    Sig: Take 1 capsule (100 mg total) by mouth 3 (three) times daily as needed.    Dispense:  20 capsule    Refill:  0    Order Specific Question:   Supervising Provider    Answer:   Penni Homans A [id number]

## 2021-06-16 NOTE — Assessment & Plan Note (Signed)
New. Given duration of symptoms and productive nature of cough, will plan rx with azithromycin (Zpak) and tessalon prn. Pt is advised to call if symptoms worsen or if they fail to improve.

## 2021-06-17 ENCOUNTER — Ambulatory Visit (INDEPENDENT_AMBULATORY_CARE_PROVIDER_SITE_OTHER): Payer: BC Managed Care – PPO | Admitting: Family Medicine

## 2021-06-17 ENCOUNTER — Other Ambulatory Visit (HOSPITAL_COMMUNITY)
Admission: RE | Admit: 2021-06-17 | Discharge: 2021-06-17 | Disposition: A | Discharge status: Home / Self Care | Payer: BC Managed Care – PPO | Source: Ambulatory Visit | Attending: Family Medicine | Admitting: Family Medicine

## 2021-06-17 ENCOUNTER — Encounter: Payer: Self-pay | Admitting: Family Medicine

## 2021-06-17 VITALS — Temp 98.4°F | Ht 65.0 in

## 2021-06-17 DIAGNOSIS — Z01419 Encounter for gynecological examination (general) (routine) without abnormal findings: Secondary | ICD-10-CM | POA: Insufficient documentation

## 2021-06-17 DIAGNOSIS — R87612 Low grade squamous intraepithelial lesion on cytologic smear of cervix (LGSIL): Secondary | ICD-10-CM | POA: Diagnosis not present

## 2021-06-17 DIAGNOSIS — Z3009 Encounter for other general counseling and advice on contraception: Secondary | ICD-10-CM | POA: Diagnosis not present

## 2021-06-17 DIAGNOSIS — E038 Other specified hypothyroidism: Secondary | ICD-10-CM

## 2021-06-17 MED ORDER — NORGESTIM-ETH ESTRAD TRIPHASIC 0.18/0.215/0.25 MG-25 MCG PO TABS: 1.0000 | ORAL_TABLET | Freq: Every day | ORAL | 3 refills | Status: DC

## 2021-06-17 NOTE — Progress Notes (Signed)
GYNECOLOGY ANNUAL PREVENTATIVE CARE ENCOUNTER NOTE  Subjective:   Jenny Eaton is a 30 y.o. G37P1001 female here for a routine annual gynecologic exam.  Current complaints: none.   Denies abnormal vaginal bleeding, discharge, pelvic pain, problems with intercourse or other gynecologic concerns.    Gynecologic History Patient's last menstrual period was 06/10/2021. Patient is sexually active  Contraception: OCP (estrogen/progesterone) Last Pap: 2020 - normal. Had abnormal PAP in 2017, but all normal since then. Last mammogram: n/a. Results were: normal Colorectal Cancer Screening: n/a.  Obstetric History OB History  Gravida Para Term Preterm AB Living  _0 0 0 1  SAB IAB Ectopic Multiple Live Births  0 0 0 0 1    # Outcome Date GA Lbr Len/2nd Weight Sex Delivery Anes PTL Lv  1 Term 10/22/19 1w0d01:14 / 00:57 7 lb 6.9 oz (3.37 kg) F Vag-Spont EPI, Local  LIV    Past Medical History:  Diagnosis Date   History of Hashimoto thyroiditis 2010   Hypothyroidism    Microscopic hematuria    negative work up 2019   Migraines    Thyroid disease    hypothyroid    Past Surgical History:  Procedure Laterality Date   excision of vaginal septum  2008   WISDOM TOOTH EXTRACTION  2011    Current Outpatient Medications on File Prior to Visit  Medication Sig Dispense Refill   acetaminophen (TYLENOL) 325 MG tablet Take 2 tablets (650 mg total) by mouth every 4 (four) hours as needed (for pain scale < 4).     azithromycin (ZITHROMAX) 250 MG tablet Take 2 tablets on day 1, then 1 tablet daily on days 2 through 5 6 tablet 0   benzonatate (TESSALON) 100 MG capsule Take 1 capsule (100 mg total) by mouth 3 (three) times daily as needed. 20 capsule 0   ibuprofen (ADVIL) 600 MG tablet Take 1 tablet (600 mg total) by mouth every 6 (six) hours. 30 tablet 0   levothyroxine (SYNTHROID) 50 MCG tablet TAKE 1 TABLET DAILY BEFORE BREAKFAST 90 tablet 3   Norgestimate-Ethinyl Estradiol Triphasic  0.18/0.215/0.25 MG-25 MCG tab Take 1 tablet by mouth daily. 30 tablet 12   SUMAtriptan (IMITREX) 50 MG tablet May repeat in 2 hours if headache persists (max 2 tabs/24 hrs) 30 tablet 1   No current facility-administered medications on file prior to visit.    Allergies  Allergen Reactions   Cephalosporins Hives   Penicillins Hives    Social History   Socioeconomic History   Marital status: Married    Spouse name: TAdel Neyer  Number of children: Not on file   Years of education: Not on file   Highest education level: Not on file  Occupational History   Not on file  Tobacco Use   Smoking status: Never   Smokeless tobacco: Never  Vaping Use   Vaping Use: Never used  Substance and Sexual Activity   Alcohol use: Not Currently   Drug use: Never   Sexual activity: Yes    Birth control/protection: None  Other Topics Concern   Not on file  Social History Narrative   Married   Daughter DDarnelle Bos born 10/22/2019   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   Completed Bachelors degree   Enjoys pScientist, water quality cooking, husband is a gTEFL teacher enjoys outdoors   One cPrimary school teacherof HRadio broadcast assistantStrain: Not  on file  Food Insecurity: Not on file  Transportation Needs: Not on file  Physical Activity: Not on file  Stress: Not on file  Social Connections: Not on file  Intimate Partner Violence: Not on file    Family History  Problem Relation Age of Onset   Hyperlipidemia Mother    Hashimoto's thyroiditis Maternal Grandmother    Kidney cancer Maternal Grandmother    Pulmonary embolism Maternal Grandmother    Cancer Maternal Grandmother    Diabetes Maternal Grandfather    Heart disease Maternal Grandfather        CAD- living, ?hx of cabg   Hypertension Maternal Grandfather    Hypertension Paternal Grandmother    Stroke Paternal Grandmother    COPD Neg Hx     The following portions of the  patient's history were reviewed and updated as appropriate: allergies, current medications, past family history, past medical history, past social history, past surgical history and problem list.  Review of Systems Pertinent items are noted in HPI.   Objective:  Temp 98.4 F (36.9 C) (Oral)   Ht 5' 5" (1.651 m)   LMP 06/10/2021   BMI 21.30 kg/m  Wt Readings from Last 3 Encounters:  06/16/21 128 lb (58.1 kg)  03/02/21 128 lb 3.2 oz (58.2 kg)  06/04/20 137 lb 3.2 oz (62.2 kg)     Chaperone present during exam  CONSTITUTIONAL: Well-developed, well-nourished female in no acute distress.  HENT:  Normocephalic, atraumatic, External right and left ear normal. Oropharynx is clear and moist EYES: Conjunctivae and EOM are normal. Pupils are equal, round, and reactive to light. No scleral icterus.  NECK: Normal range of motion, supple, no masses.  Normal thyroid.   CARDIOVASCULAR: Normal heart rate noted, regular rhythm RESPIRATORY: Clear to auscultation bilaterally. Effort and breath sounds normal, no problems with respiration noted. BREASTS: Symmetric in size. No masses, skin changes, nipple drainage, or lymphadenopathy. ABDOMEN: Soft, normal bowel sounds, no distention noted.  No tenderness, rebound or guarding.  PELVIC: Normal appearing external genitalia; normal appearing vaginal mucosa and cervix.  No abnormal discharge noted.   MUSCULOSKELETAL: Normal range of motion. No tenderness.  No cyanosis, clubbing, or edema.  2+ distal pulses. SKIN: Skin is warm and dry. No rash noted. Not diaphoretic. No erythema. No pallor. NEUROLOGIC: Alert and oriented to person, place, and time. Normal reflexes, muscle tone coordination. No cranial nerve deficit noted. PSYCHIATRIC: Normal mood and affect. Normal behavior. Normal judgment and thought content.  Assessment:  Annual gynecologic examination with pap smear   Plan:  1. Well Woman Exam Will follow up results of pap smear and manage  accordingly. STD testing discussed. Patient declined testing - Cytology - PAP( [place]) - TSH - CBC - Comp Met (CMET)  2. Other specified hypothyroidism - TSH  3. Low grade squamous intraepithelial lesion on cytologic smear of cervix (LGSIL) LSIL in 2017. Normal since then.   Routine preventative health maintenance measures emphasized. Please refer to After Visit Summary for other counseling recommendations.    Loma Boston, [place] for [employer]

## 2021-06-18 LAB — COMPREHENSIVE METABOLIC PANEL
ALT: 7 IU/L (ref 0–32)
AST: 11 IU/L (ref 0–40)
Albumin/Globulin Ratio: 2 (ref 1.2–2.2)
Albumin: 4.5 g/dL (ref 3.8–4.8)
Alkaline Phosphatase: 41 IU/L — ABNORMAL LOW (ref 44–121)
BUN/Creatinine Ratio: 15 (ref 9–23)
BUN: 10 mg/dL (ref 6–20)
Bilirubin Total: 0.4 mg/dL (ref 0.0–1.2)
CO2: 25 mmol/L (ref 20–29)
Calcium: 9.8 mg/dL (ref 8.7–10.2)
Chloride: 101 mmol/L (ref 96–106)
Creatinine, Ser: 0.68 mg/dL (ref 0.57–1.00)
Globulin, Total: 2.3 g/dL (ref 1.5–4.5)
Glucose: 88 mg/dL (ref 65–99)
Potassium: 4.4 mmol/L (ref 3.5–5.2)
Sodium: 139 mmol/L (ref 134–144)
Total Protein: 6.8 g/dL (ref 6.0–8.5)
eGFR: 119 mL/min/{1.73_m2} (ref >59)

## 2021-06-18 LAB — CBC
Hematocrit: 42.9 % (ref 34.0–46.6)
Hemoglobin: 14 g/dL (ref 11.1–15.9)
MCH: 30 pg (ref 26.6–33.0)
MCHC: 32.6 g/dL (ref 31.5–35.7)
MCV: 92 fL (ref 79–97)
Platelets: 330 10*3/uL (ref 150–450)
RBC: 4.66 x10E6/uL (ref 3.77–5.28)
RDW: 11.8 % (ref 11.7–15.4)
WBC: 5.1 10*3/uL (ref 3.4–10.8)

## 2021-06-18 LAB — TSH: TSH: 1.53 u[IU]/mL (ref 0.450–4.500)

## 2021-06-24 LAB — CYTOLOGY - PAP
Comment: NEGATIVE
Diagnosis: NEGATIVE
High risk HPV: NEGATIVE

## 2021-08-31 ENCOUNTER — Encounter: Payer: Self-pay | Admitting: Family

## 2021-08-31 MED ORDER — FLUCONAZOLE 150 MG PO TABS: 150.0000 mg | ORAL_TABLET | ORAL | 0 refills | Status: DC

## 2021-10-18 NOTE — L&D Delivery Note (Signed)
OB/GYN Faculty Practice Delivery Note  Jenny Eaton is a 31 y.o. G2P1001 s/p SVD at [redacted]w[redacted]d She was admitted for SOL.   ROM: 1h 470mith clear fluid GBS Status: negative Maximum Maternal Temperature: 98.1  Labor Progress: Once pt repositioned on to left side she began feeling rectal pressure. Found to be complete but still only 0 station so repositioned to right side to labor down. Spent about 2080mthere before baby had an early deceleration and pt reported more vaginal pressure.  Delivery Date/Time: 09/21/22 at 0547 Delivery: Head delivered LOP. No nuchal cord present. Shoulder and body delivered in usual fashion. Infant with spontaneous cry, placed on mother's abdomen, dried and stimulated. Cord clamped x 2 after 2-minute delay, and cut by FOB. Cord blood drawn and cord sample collected. Placenta delivered spontaneously, intact, with 3-vessel cord. Fundus firm with massage and Pitocin. Labia, perineum, vagina, and cervix inspected, 1st degree perineal laceration found and repaired with 3.0 vicryl with 4.0 vicryl for superficial interrupted stitches.   Placenta: Intact, spontaneous, sent to L&D for disposal Complications: None Lacerations: 1st degree perineal EBL: 50 Analgesia: epidural  Postpartum Planning '[x]'$  transfer orders to MB '[x]'$  discharge summary started & shared '[x]'$  message to sent to schedule follow-up  '[x]'$  lists updated '[x]'$  vaccines UTD  Infant: Girl (SoJillyn LedgerAPGARs 9/10  3690g  JamGaylan GeroldNMHamblenBCHickory Valleyrtified Nurse Midwife, FacColonial Outpatient Surgery Centerr WomDean Foods CompanyonOrlandoup 09/21/2022, 6:59 AM

## 2021-12-21 DIAGNOSIS — B360 Pityriasis versicolor: Secondary | ICD-10-CM | POA: Diagnosis not present

## 2022-02-24 ENCOUNTER — Telehealth: Payer: Self-pay

## 2022-02-24 NOTE — Telephone Encounter (Signed)
Patient states that she is pregnant and that she needs to know how to adjust her medication. Patient can't get in until July to see you.  ?

## 2022-02-25 NOTE — Telephone Encounter (Signed)
Patient notified and has been scheduled for 03/22/22 at 3pm.  ?

## 2022-03-03 ENCOUNTER — Encounter: Payer: Self-pay | Admitting: Family Medicine

## 2022-03-03 ENCOUNTER — Other Ambulatory Visit (HOSPITAL_COMMUNITY)
Admission: RE | Admit: 2022-03-03 | Discharge: 2022-03-03 | Disposition: A | Discharge status: Home / Self Care | Payer: BC Managed Care – PPO | Source: Ambulatory Visit | Attending: Family Medicine | Admitting: Family Medicine

## 2022-03-03 ENCOUNTER — Ambulatory Visit (INDEPENDENT_AMBULATORY_CARE_PROVIDER_SITE_OTHER): Payer: BC Managed Care – PPO | Admitting: Family Medicine

## 2022-03-03 ENCOUNTER — Other Ambulatory Visit: Payer: Self-pay | Admitting: Internal Medicine

## 2022-03-03 VITALS — BP 116/78 | HR 130 | Wt 128.0 lb

## 2022-03-03 DIAGNOSIS — Z3480 Encounter for supervision of other normal pregnancy, unspecified trimester: Secondary | ICD-10-CM | POA: Insufficient documentation

## 2022-03-03 DIAGNOSIS — E038 Other specified hypothyroidism: Secondary | ICD-10-CM | POA: Diagnosis not present

## 2022-03-03 DIAGNOSIS — Z3481 Encounter for supervision of other normal pregnancy, first trimester: Secondary | ICD-10-CM | POA: Diagnosis not present

## 2022-03-03 DIAGNOSIS — Z31430 Encounter of female for testing for genetic disease carrier status for procreative management: Secondary | ICD-10-CM | POA: Diagnosis not present

## 2022-03-03 NOTE — Progress Notes (Signed)
DATING AND VIABILITY SONOGRAM ? ? ?Jenny Eaton is a 31 y.o. year old G2P1001 with LMP Patient's last menstrual period was 12/16/2021. which would correlate to  41w0dweeks gestation.  She has regular menstrual cycles.   She is here today for a confirmatory initial sonogram. ? ? ? ?GESTATION: ?SINGLETON    ? ?FETAL ACTIVITY: ?         Heart rate         162 bpm ?         The fetus is active. ? ? ? ?ADNEXA: ?The ovaries are normal. ? ? ?GESTATIONAL AGE AND  BIOMETRICS: ? ?Gestational criteria: Estimated Date of Delivery: 09/22/22 by LMP now at 172w0d ?Previous Scans:0 ? ?                      ?CROWN RUMP LENGTH          4.81 cm         11-4 weeks  ?    ?    ?    ?    ?    ?    ?    ? ?                                                AVERAGE EGA(BY THIS SCAN):  11-4 weeks ? ?WORKING EDD( LMP ):  09-22-2022 ?  ? ? ?TECHNICIAN COMMENTS: ? ?Patient informed that the ultrasound is considered a limited obstetric ultrasound and is not intended to be a complete ultrasound exam.  Patient also informed that the ultrasound is not being completed with the intent of assessing for fetal or placental anomalies or any pelvic abnormalities. Explained that the purpose of today's ultrasound is to assess for fetal heart rate.  Patient acknowledges the purpose of the exam and the limitations of the study.    ? ?JeKathrene Alu5/17/2023 ?9:52 AM ?  ?

## 2022-03-03 NOTE — Progress Notes (Signed)
?Subjective:  ?Jenny Eaton is a G2P1001 6w0dby LMP c/w 1st trimester [place], being seen today for her first obstetrical visit.  Her obstetrical history is significant for  hypothyroidism. Has history of normal SVD . Patient does intend to breast feed. Pregnancy history fully reviewed. ? ?Patient reports nausea. ? ?BP 116/78   Pulse (!) 130   Wt 128 lb (58.1 kg)   LMP 12/16/2021   BMI 21.30 kg/m?  ? ?HISTORY: ?OB History  ?Gravida Para Term Preterm AB Living  ?'2 1 1 '$ 0 0 1  ?SAB IAB Ectopic Multiple Live Births  ?0 0 0 0 1  ?  ?# Outcome Date GA Lbr Len/2nd Weight Sex Delivery Anes PTL Lv  ?2 Current           ?1 Term 10/22/19 426w0d1:14 / 00:57 7 lb 6.9 oz (3.37 kg) F Vag-Spont EPI, Local  LIV  ? ? ?Past Medical History:  ?Diagnosis Date  ? History of Hashimoto thyroiditis 2010  ? Hypothyroidism   ? Microscopic hematuria   ? negative work up 2019  ? Migraines   ? Thyroid disease   ? hypothyroid  ? ? ?Past Surgical History:  ?Procedure Laterality Date  ? excision of vaginal septum  2008  ? [place]  ? ? ?Family History  ?Problem Relation Age of Onset  ? Hyperlipidemia Mother   ? Hashimoto's thyroiditis Maternal Grandmother   ? Kidney cancer Maternal Grandmother   ? Pulmonary embolism Maternal Grandmother   ? Cancer Maternal Grandmother   ? Diabetes Maternal Grandfather   ? Heart disease Maternal Grandfather   ?     CAD- living, ?hx of cabg  ? Hypertension Maternal Grandfather   ? Hypertension Paternal Grandmother   ? Stroke Paternal Grandmother   ? COPD Neg Hx   ? ? ? ?Exam  ?BP 116/78   Pulse (!) 130   Wt 128 lb (58.1 kg)   LMP 12/16/2021   BMI 21.30 kg/m?  ? ?Chaperone present during exam ? ?CONSTITUTIONAL: Well-developed, well-nourished female in no acute distress.  ?HENT:  Normocephalic, atraumatic, External right and left ear normal. Oropharynx is clear and moist ?EYES: Conjunctivae and EOM are normal. Pupils are equal, round, and reactive to light. No scleral icterus.   ?NECK: Normal range of motion, supple, no masses.  Normal thyroid.  ?CARDIOVASCULAR: Normal heart rate noted, regular rhythm ?RESPIRATORY: Clear to auscultation bilaterally. Effort and breath sounds normal, no problems with respiration noted. ?BREASTS: Symmetric in size. No masses, skin changes, nipple drainage, or lymphadenopathy. ?ABDOMEN: Soft, normal bowel sounds, no distention noted.  No tenderness, rebound or guarding.  ?PELVIC: Normal appearing external genitalia. ?MUSCULOSKELETAL: Normal range of motion. No tenderness.  No cyanosis, clubbing, or edema.  2+ distal pulses. ?SKIN: Skin is warm and dry. No rash noted. Not diaphoretic. No erythema. No pallor. ?NEUROLOGIC: Alert and oriented to person, place, and time. Normal reflexes, muscle tone coordination. No cranial nerve deficit noted. ?PSYCHIATRIC: Normal mood and affect. Normal behavior. Normal judgment and thought content. ? ?  ?Assessment:  ? ? Pregnancy: G2P1001 ?Patient Active Problem List  ? Diagnosis Date Noted  ? Supervision of other normal pregnancy, antepartum 03/03/2022  ? Bronchitis 06/16/2021  ? Tinea corporis 03/02/2021  ? Microscopic hematuria 09/07/2018  ? Abnormal Pap smear of cervix 01/24/2016  ? Irritable bowel syndrome (IBS) 01/21/2016  ? Preventative health care 01/21/2016  ? Hypothyroidism 10/21/2015  ? Migraines 10/21/2015  ? ? ?  ?Plan:  ? ?  1. Supervision of other normal pregnancy, antepartum ?FHT normal. [place] alines with LMP.  ?- CHL AMB BABYSCRIPTS SCHEDULE OPTIMIZATION ?- Culture, OB Urine ?- GC/Chlamydia probe amp ([place])not at [facility] ?- [place] MFM OB COMP + 14 WK; Future ?- Genetic Screening ?- TSH ? ?2. Other specified hypothyroidism ?Contacted her endocrinologist, who had her start taking 9 doses per week. Will check TSH today and adjust doses. ?- TSH ? ?  ?Initial labs obtained ?Continue prenatal vitamins ?Reviewed n/v relief measures and warning s/s to report ?Reviewed recommended weight gain based on pre-gravid  BMI ?Encouraged well-balanced diet ?Genetic & carrier screening discussed: requests Panorama, \ ?Ultrasound discussed; fetal survey: requested ?CCNC completed> form faxed if has or is planning to apply for medicaid ?The nature of [facility] with multiple MDs and other Advanced Practice Providers was explained to patient; also emphasized that fellows, residents, and students are part of our team. ? No indication for ASA '81mg'$  or early GDM screen. ? ?Problem list reviewed and updated. ?75% of 30 min visit spent on counseling and coordination of care.  ?  ? ?Truett Mainland ?03/03/2022 ?

## 2022-03-04 LAB — CBC/D/PLT+RPR+RH+ABO+RUBIGG...
Antibody Screen: NEGATIVE
Basophils Absolute: 0 10*3/uL (ref 0.0–0.2)
Basos: 0 %
EOS (ABSOLUTE): 0.1 10*3/uL (ref 0.0–0.4)
Eos: 1 %
HCV Ab: NONREACTIVE
HIV Screen 4th Generation wRfx: NONREACTIVE
Hematocrit: 36.9 % (ref 34.0–46.6)
Hemoglobin: 12.6 g/dL (ref 11.1–15.9)
Hepatitis B Surface Ag: NEGATIVE
Immature Grans (Abs): 0 10*3/uL (ref 0.0–0.1)
Immature Granulocytes: 0 %
Lymphocytes Absolute: 1.3 10*3/uL (ref 0.7–3.1)
Lymphs: 19 %
MCH: 31.5 pg (ref 26.6–33.0)
MCHC: 34.1 g/dL (ref 31.5–35.7)
MCV: 92 fL (ref 79–97)
Monocytes Absolute: 0.3 10*3/uL (ref 0.1–0.9)
Monocytes: 5 %
Neutrophils Absolute: 5 10*3/uL (ref 1.4–7.0)
Neutrophils: 75 %
Platelets: 245 10*3/uL (ref 150–450)
RBC: 4 x10E6/uL (ref 3.77–5.28)
RDW: 12 % (ref 11.7–15.4)
RPR Ser Ql: NONREACTIVE
Rh Factor: POSITIVE
Rubella Antibodies, IGG: 2.46 index (ref >0.99)
WBC: 6.7 10*3/uL (ref 3.4–10.8)

## 2022-03-04 LAB — GC/CHLAMYDIA PROBE AMP (~~LOC~~) NOT AT ARMC
Chlamydia: NEGATIVE
Comment: NEGATIVE
Comment: NORMAL
Neisseria Gonorrhea: NEGATIVE

## 2022-03-04 LAB — HCV INTERPRETATION

## 2022-03-04 LAB — TSH: TSH: 1.26 u[IU]/mL (ref 0.450–4.500)

## 2022-03-05 LAB — URINE CULTURE, OB REFLEX: Organism ID, Bacteria: NO GROWTH

## 2022-03-05 LAB — CULTURE, OB URINE

## 2022-03-08 ENCOUNTER — Encounter: Payer: BC Managed Care – PPO | Admitting: Family

## 2022-03-22 ENCOUNTER — Ambulatory Visit: Payer: BC Managed Care – PPO | Admitting: Internal Medicine

## 2022-03-22 ENCOUNTER — Encounter: Payer: Self-pay | Admitting: Internal Medicine

## 2022-03-22 VITALS — BP 102/70 | HR 99 | Ht 65.0 in | Wt 134.0 lb

## 2022-03-22 DIAGNOSIS — E038 Other specified hypothyroidism: Secondary | ICD-10-CM | POA: Diagnosis not present

## 2022-03-22 NOTE — Patient Instructions (Signed)

## 2022-03-22 NOTE — Progress Notes (Unsigned)
Marland Kitchenlogoend   Name: Jenny Eaton  MRN/ DOB: [id number], 25-May-1991    Age/ Sex: 31 y.o., female     PCP: Debbrah Alar, NP   Reason for Endocrinology Evaluation: Hypothyroidism     Initial Endocrinology Clinic Visit: 01/03/2019    PATIENT IDENTIFIER: Jenny Eaton is a 31 y.o., female with a past medical history of migraine headaches and hypothyroidism. She has followed with [facility] since 01/03/2019 for consultative assistance with management of her hypothyroidism.   HISTORICAL SUMMARY: The patient was first diagnosed with hypothyroidism in 2006, she was in 8th grade at the time with hair loss, fatigue and dry eyes.   She has been on levothyroxine 50 mcg daily for a long time , she was also on OCP's but stopped them in October,2019 and started prenatals and that's when her TFT's became abnormal with a TSH of 0.34 uIU/mL. She was advised by her PCP to reduce the dose to 25 mcg with repeat TST of 6.54 uIU/mL.    S/P normal vaginal delivery 10/22/2019 [facility])  SUBJECTIVE:     Today (03/22/2022):  Jenny Eaton is here for a   follow up appointment on her hypothyroidism during pregnancy. She has NOT been seen in 22 months    She is currently at 13.5 weeks of gestation  EDD 12/6/223 Weight stable overall  Had nausea but this is improving , minimal vomiting  Mild constipation which also has resolved  Appetite stable  Denies local neck swelling  Denies palpitations  Denies tremors   She is compliant with LT-4 replacement , she takes it appropriately.   Levothyroxine 50 mcg 2 tabs on Wednesday and Sundays and 1 tab rest of the week        HISTORY:  Past Medical History:  Past Medical History:  Diagnosis Date   History of Hashimoto thyroiditis 2010   Hypothyroidism    Microscopic hematuria    negative work up 2019   Migraines    Thyroid disease    hypothyroid   Past Surgical History:  Past Surgical History:  Procedure Laterality Date    excision of vaginal septum  2008   WISDOM TOOTH EXTRACTION  2011   Social History:  reports that she has never smoked. She has never used smokeless tobacco. She reports that she does not currently use alcohol. She reports that she does not use drugs. Family History:  Family History  Problem Relation Age of Onset   Hyperlipidemia Mother    Hashimoto's thyroiditis Maternal Grandmother    Kidney cancer Maternal Grandmother    Pulmonary embolism Maternal Grandmother    Cancer Maternal Grandmother    Diabetes Maternal Grandfather    Heart disease Maternal Grandfather        CAD- living, ?hx of cabg   Hypertension Maternal Grandfather    Hypertension Paternal Grandmother    Stroke Paternal Grandmother    COPD Neg Hx      HOME MEDICATIONS: Allergies as of 03/22/2022       Reactions   Cephalosporins Hives   Penicillins Hives        Medication List        Accurate as of March 22, 2022  3:08 PM. If you have any questions, ask your nurse or doctor.          levothyroxine 50 MCG tablet Commonly known as: SYNTHROID TAKE 1 TABLET DAILY BEFORE BREAKFAST What changed:  how much to take how to take this when to take this additional instructions  PRENATAL VITAMINS PO Take by mouth.          OBJECTIVE:   PHYSICAL EXAM: VS: BP 102/70 (BP Location: Left Arm, Patient Position: Sitting, Cuff Size: Small)   Pulse 99   Ht '5\' 5"'$  (1.651 m)   Wt 134 lb (60.8 kg)   LMP 12/16/2021   SpO2 100%   BMI 22.30 kg/m    EXAM: General: Pt appears well and is in NAD  Neck: General: Supple without adenopathy. Thyroid: Thyroid size normal.  No goiter or nodules appreciated. No thyroid bruit.  Lungs: Clear with good BS bilat with no rales, rhonchi, or wheezes  Heart: Auscultation: RRR.  Abdomen: Normoactive bowel sounds, soft, nontender, without masses or organomegaly palpable  Extremities:  BL LE: No pretibial edema normal ROM and strength.  Mental Status: Judgment, insight:  Intact Mood and affect: No depression, anxiety, or agitation     DATA REVIEWED:  ***   ASSESSMENT / PLAN / RECOMMENDATIONS:   Hypothyroidism :   - Pt is clinically and biochemically euthyroid - She is compliant with LT-4 replacement.  - She denies any local neck symptoms    Medications   Continue Levothyroxine 50 mcg daily     F/U in 1 yr     Signed electronically by: Mack Guise, MD  [facility]  [address] [place], [place] [address] Phone: [phone] FAX: [phone]      CC: Debbrah Alar, [address] [place] [address] Phone: [phone]  Fax: [phone]   Return to Endocrinology clinic as below: Future Appointments  Date Time Provider [place]  03/29/2022  8:55 AM Chancy Milroy, MD [facility] None  04/30/2022  9:55 AM Radene Gunning, MD [facility] None  05/03/2022  9:00 AM [facility] US1 [facility]  05/28/2022  8:15 AM Truett Mainland, DO [facility] None

## 2022-03-23 LAB — TSH: TSH: 1.33 u[IU]/mL (ref 0.35–5.50)

## 2022-03-23 MED ORDER — LEVOTHYROXINE SODIUM 75 MCG PO TABS: 75.0000 ug | ORAL_TABLET | Freq: Every day | ORAL | 3 refills | Status: DC

## 2022-03-29 ENCOUNTER — Ambulatory Visit (INDEPENDENT_AMBULATORY_CARE_PROVIDER_SITE_OTHER): Payer: BC Managed Care – PPO | Admitting: Obstetrics and Gynecology

## 2022-03-29 VITALS — BP 106/67 | HR 100 | Wt 135.0 lb

## 2022-03-29 DIAGNOSIS — E038 Other specified hypothyroidism: Secondary | ICD-10-CM

## 2022-03-29 DIAGNOSIS — Z3480 Encounter for supervision of other normal pregnancy, unspecified trimester: Secondary | ICD-10-CM

## 2022-03-29 DIAGNOSIS — Z3A14 14 weeks gestation of pregnancy: Secondary | ICD-10-CM

## 2022-03-29 NOTE — Progress Notes (Signed)
Subjective:  Jenny Eaton is a 31 y.o. G2P1001 at 57w5dbeing seen today for ongoing prenatal care.  She is currently monitored for the following issues for this low-risk pregnancy and has Hypothyroidism; Migraines; Irritable bowel syndrome (IBS); Preventative health care; Abnormal Pap smear of cervix; Microscopic hematuria; Tinea corporis; Bronchitis; and Supervision of other normal pregnancy, antepartum on their problem list.  Patient reports no complaints.  Contractions: Not present. Vag. Bleeding: None.  Movement: Absent. Denies leaking of fluid.   The following portions of the patient's history were reviewed and updated as appropriate: allergies, current medications, past family history, past medical history, past social history, past surgical history and problem list. Problem list updated.  Objective:   Vitals:   03/29/22 0853  BP: 106/67  Pulse: 100  Weight: 135 lb (61.2 kg)    Fetal Status: Fetal Heart Rate (bpm): 145   Movement: Absent     General:  Alert, oriented and cooperative. Patient is in no acute distress.  Skin: Skin is warm and dry. No rash noted.   Cardiovascular: Normal heart rate noted  Respiratory: Normal respiratory effort, no problems with respiration noted  Abdomen: Soft, gravid, appropriate for gestational age. Pain/Pressure: Absent     Pelvic:  Cervical exam deferred        Extremities: Normal range of motion.  Edema: None  Mental Status: Normal mood and affect. Normal behavior. Normal judgment and thought content.   Urinalysis:      Assessment and Plan:  Pregnancy: G2P1001 at 14w5d1. Supervision of other normal pregnancy, antepartum stable Anatomy scan scheduled for next month  2. Other specified hypothyroidism Stable Followed by endocrine  Preterm labor symptoms and general obstetric precautions including but not limited to vaginal bleeding, contractions, leaking of fluid and fetal movement were reviewed in detail with the patient. Please  refer to After Visit Summary for other counseling recommendations.  Return in about 4 weeks (around 04/26/2022) for OB visit, face to face, any provider.   ErChancy MilroyMD

## 2022-03-29 NOTE — Patient Instructions (Signed)

## 2022-04-17 ENCOUNTER — Encounter: Payer: Self-pay | Admitting: Family Medicine

## 2022-04-26 NOTE — Progress Notes (Signed)
   PRENATAL VISIT NOTE  Subjective:  Jenny Eaton is a 31 y.o. G2P1001 at 33w2dbeing seen today for ongoing prenatal care.  She is currently monitored for the following issues for this low-risk pregnancy and has Hypothyroidism; Migraines; Irritable bowel syndrome (IBS); Preventative health care; Microscopic hematuria; Tinea corporis; Bronchitis; and Supervision of other normal pregnancy, antepartum on their problem list.  Patient reports no complaints.  Contractions: Not present. Vag. Bleeding: None.  Movement: Present. Denies leaking of fluid.   The following portions of the patient's history were reviewed and updated as appropriate: allergies, current medications, past family history, past medical history, past social history, past surgical history and problem list.   Objective:   Vitals:   04/30/22 0833  BP: 108/67  Pulse: 95  Weight: 140 lb (63.5 kg)    Fetal Status: Fetal Heart Rate (bpm): 140   Movement: Present     General:  Alert, oriented and cooperative. Patient is in no acute distress.  Skin: Skin is warm and dry. No rash noted.   Cardiovascular: Normal heart rate noted  Respiratory: Normal respiratory effort, no problems with respiration noted  Abdomen: Soft, gravid, appropriate for gestational age.  Pain/Pressure: Absent     Pelvic: Cervical exam deferred        Extremities: Normal range of motion.  Edema: None  Mental Status: Normal mood and affect. Normal behavior. Normal judgment and thought content.   Assessment and Plan:  Pregnancy: G2P1001 at 170w2d. Supervision of other normal pregnancy, antepartum Anatomy scheduled for 7/17 TSH has been normal - followed by Endo Discussed and recommended MSAFP - pt accepts  Preterm labor symptoms and general obstetric precautions including but not limited to vaginal bleeding, contractions, leaking of fluid and fetal movement were reviewed in detail with the patient. Please refer to After Visit Summary for other  counseling recommendations.   Return in about 8 weeks (around 06/25/2022) for OB VISIT, MD or APP (babyscripts - if she wants).  Future Appointments  Date Time Provider [place]7/17/2023  9:00 AM [facility] US1 [facility]8/08/2022  8:15 AM StTruett MainlandDO [facility] None  07/19/2022 12:10 PM Shamleffer, IbMelanie CrazierMD [facility] None    PaRadene GunningMD

## 2022-04-30 ENCOUNTER — Encounter: Payer: Self-pay | Admitting: Obstetrics and Gynecology

## 2022-04-30 ENCOUNTER — Ambulatory Visit (INDEPENDENT_AMBULATORY_CARE_PROVIDER_SITE_OTHER): Payer: BC Managed Care – PPO | Admitting: Obstetrics and Gynecology

## 2022-04-30 VITALS — BP 108/67 | HR 95 | Wt 140.0 lb

## 2022-04-30 DIAGNOSIS — Z3480 Encounter for supervision of other normal pregnancy, unspecified trimester: Secondary | ICD-10-CM

## 2022-04-30 DIAGNOSIS — Z3A19 19 weeks gestation of pregnancy: Secondary | ICD-10-CM

## 2022-04-30 DIAGNOSIS — Z3482 Encounter for supervision of other normal pregnancy, second trimester: Secondary | ICD-10-CM

## 2022-05-02 LAB — AFP, SERUM, OPEN SPINA BIFIDA
AFP MoM: 0.93
AFP Value: 51.2 ng/mL
Gest. Age on Collection Date: 19.2 weeks
Maternal Age At EDD: 32.7 yr
OSBR Risk 1 IN: 10000
Test Results:: NEGATIVE
Weight: 141 [lb_av]

## 2022-05-03 ENCOUNTER — Other Ambulatory Visit: Payer: Self-pay | Admitting: *Deleted

## 2022-05-03 ENCOUNTER — Ambulatory Visit: Payer: BC Managed Care – PPO | Attending: Family Medicine

## 2022-05-03 DIAGNOSIS — E039 Hypothyroidism, unspecified: Secondary | ICD-10-CM | POA: Diagnosis not present

## 2022-05-03 DIAGNOSIS — Z3480 Encounter for supervision of other normal pregnancy, unspecified trimester: Secondary | ICD-10-CM | POA: Diagnosis not present

## 2022-05-03 DIAGNOSIS — O99282 Endocrine, nutritional and metabolic diseases complicating pregnancy, second trimester: Secondary | ICD-10-CM | POA: Diagnosis not present

## 2022-05-03 DIAGNOSIS — Z3A19 19 weeks gestation of pregnancy: Secondary | ICD-10-CM | POA: Diagnosis not present

## 2022-05-03 DIAGNOSIS — Z363 Encounter for antenatal screening for malformations: Secondary | ICD-10-CM | POA: Diagnosis not present

## 2022-05-10 ENCOUNTER — Ambulatory Visit: Payer: BC Managed Care – PPO | Admitting: Internal Medicine

## 2022-05-28 ENCOUNTER — Encounter: Payer: BC Managed Care – PPO | Admitting: Family Medicine

## 2022-05-31 ENCOUNTER — Encounter: Payer: Self-pay | Admitting: Family Medicine

## 2022-06-15 ENCOUNTER — Ambulatory Visit: Payer: BC Managed Care – PPO | Admitting: Family

## 2022-06-15 DIAGNOSIS — J40 Bronchitis, not specified as acute or chronic: Secondary | ICD-10-CM

## 2022-06-15 MED ORDER — ALBUTEROL SULFATE HFA 108 (90 BASE) MCG/ACT IN AERS: 2.0000 | INHALATION_SPRAY | Freq: Four times a day (QID) | RESPIRATORY_TRACT | 0 refills | Status: DC | PRN

## 2022-06-15 MED ORDER — AZITHROMYCIN 250 MG PO TABS: ORAL_TABLET | ORAL | 0 refills | Status: AC

## 2022-06-15 NOTE — Progress Notes (Signed)
Subjective:     Patient ID: Jenny Eaton, female    DOB: 14-May-1991, 31 y.o.   MRN: [id number]  Chief Complaint  Patient presents with   Cough    Complains of productive cough for 2 weeks, had 2 negative covid test at home   Generalized Body Aches    Complains of body aches for 5 days    Daughter and husband had a cough earlier this month.  Reports that at the beginning of the first week she had a bad cough only. She lost her voice for 1 week.  Then cough became productive.  She contacted OB and was told ok to take robitussin/mucinex.  Did not help that much.  Last week she reports body aches chills/sweats Tylenol helped. No improvement over the weekend. Denies SOB.  She is currently [redacted] weeks pregnant.   Health Maintenance Due  Topic Date Due   COVID-19 Vaccine (4 - Pfizer series) 12/25/2020   INFLUENZA VACCINE  05/18/2022    Past Medical History:  Diagnosis Date   History of Hashimoto thyroiditis 2010   Hypothyroidism    Microscopic hematuria    negative work up 2019   Migraines    Thyroid disease    hypothyroid    Past Surgical History:  Procedure Laterality Date   excision of vaginal septum  2008   WISDOM TOOTH EXTRACTION  2011    Family History  Problem Relation Age of Onset   Hyperlipidemia Mother    Hashimoto's thyroiditis Maternal Grandmother    Kidney cancer Maternal Grandmother    Pulmonary embolism Maternal Grandmother    Cancer Maternal Grandmother    Diabetes Maternal Grandfather    Heart disease Maternal Grandfather        CAD- living, ?hx of cabg   Hypertension Maternal Grandfather    Hypertension Paternal Grandmother    Stroke Paternal Grandmother    COPD Neg Hx     Social History   Socioeconomic History   Marital status: Married    Spouse name: Jenny Eaton   Number of children: Not on file   Years of education: Not on file   Highest education level: Not on file  Occupational History   Not on file  Tobacco Use   Smoking status:  Never   Smokeless tobacco: Never  Vaping Use   Vaping Use: Never used  Substance and Sexual Activity   Alcohol use: Not Currently   Drug use: Never   Sexual activity: Yes    Birth control/protection: None  Other Topics Concern   Not on file  Social History Narrative   Married   Daughter Jenny Eaton- born 10/22/2019   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   Completed Bachelors degree   Enjoys Scientist, water quality, cooking, husband is a TEFL teacher- enjoys outdoors   One Neurosurgeon   Social Determinants of Radio broadcast assistant Strain: Not on file  Food Insecurity: Not on file  Transportation Needs: Not on file  Physical Activity: Not on file  Stress: No Stress Concern Present (04/03/2019)   [employer] [place]    Feeling of Stress : Only a little  Social Connections: Not on file  Intimate Partner Violence: Not on file    Outpatient Medications Prior to Visit  Medication Sig Dispense Refill   levothyroxine (SYNTHROID) 75 MCG tablet Take 1 tablet (75 mcg total) by mouth daily. 90 tablet 3  Prenatal Vit-Fe Fumarate-FA (PRENATAL VITAMINS PO) Take by mouth.     No facility-administered medications prior to visit.    Allergies  Allergen Reactions   Cephalosporins Hives   Penicillins Hives    Review of Systems  Respiratory:  Positive for cough.       See HPI Objective:    Physical Exam Constitutional:      General: She is not in acute distress.    Appearance: Normal appearance. She is well-developed.  HENT:     Head: Normocephalic and atraumatic.     Right Ear: Tympanic membrane, ear canal and external ear normal.     Left Ear: Tympanic membrane, ear canal and external ear normal.     Mouth/Throat:     Mouth: Mucous membranes are moist.     Pharynx: Oropharynx is clear.     Tonsils: No tonsillar exudate or tonsillar abscesses.  Eyes:     General: No scleral  icterus. Neck:     Thyroid: No thyromegaly.  Cardiovascular:     Rate and Rhythm: Normal rate and regular rhythm.     Heart sounds: Normal heart sounds. No murmur heard. Pulmonary:     Effort: Pulmonary effort is normal. No respiratory distress.     Breath sounds: Examination of the left-upper field reveals wheezing. Wheezing (expiratory) present.  Musculoskeletal:     Cervical back: Neck supple.  Skin:    General: Skin is warm and dry.  Neurological:     Mental Status: She is alert and oriented to person, place, and time.  Psychiatric:        Mood and Affect: Mood normal.        Behavior: Behavior normal.        Thought Content: Thought content normal.        Judgment: Judgment normal.     BP 115/68 (BP Location: Right Arm, Patient Position: Sitting, Cuff Size: Small)   Pulse (!) 102   Temp 98.2 F (36.8 C) (Oral)   Resp 16   Wt 149 lb (67.6 kg)   LMP 12/16/2021   SpO2 97%   BMI 24.79 kg/m  Wt Readings from Last 3 Encounters:  06/15/22 149 lb (67.6 kg)  04/30/22 140 lb (63.5 kg)  03/29/22 135 lb (61.2 kg)       Assessment & Plan:   Problem List Items Addressed This Visit       Unprioritized   Bronchitis    New.  She is allergic to penicillin and cephalosporins. Given duration of symptoms will rx with azithromycin.  For wheezing add albuterol 2 puffs every 6 hours as needed. Can continue robitussin/mucinex prn. She is advised to call if symptoms worsen or if symptoms fail to improve.        I am having Jenny Eaton "Jenny Eaton" start on azithromycin and albuterol. I am also having her maintain her Prenatal Vit-Fe Fumarate-FA (PRENATAL VITAMINS PO) and levothyroxine.  Meds ordered this encounter  Medications   azithromycin (ZITHROMAX) 250 MG tablet    Sig: Take 2 tablets on day 1, then 1 tablet daily on days 2 through 5    Dispense:  6 tablet    Refill:  0    Order Specific Question:   Supervising Provider    Answer:   Penni Homans A [[id number]]   albuterol  (VENTOLIN HFA) 108 (90 Base) MCG/ACT inhaler    Sig: Inhale 2 puffs into the lungs every 6 (six) hours as needed for wheezing or shortness of breath.  Dispense:  8 g    Refill:  0    Order Specific Question:   Supervising Provider    Answer:   Penni Homans A [[id number]]

## 2022-06-15 NOTE — Assessment & Plan Note (Addendum)
New.  She is allergic to penicillin and cephalosporins. Given duration of symptoms will rx with azithromycin.  For wheezing add albuterol 2 puffs every 6 hours as needed. Can continue robitussin/mucinex prn. She is advised to call if symptoms worsen or if symptoms fail to improve.

## 2022-06-17 DIAGNOSIS — L918 Other hypertrophic disorders of the skin: Secondary | ICD-10-CM | POA: Diagnosis not present

## 2022-06-17 DIAGNOSIS — L578 Other skin changes due to chronic exposure to nonionizing radiation: Secondary | ICD-10-CM | POA: Diagnosis not present

## 2022-06-17 DIAGNOSIS — D225 Melanocytic nevi of trunk: Secondary | ICD-10-CM | POA: Diagnosis not present

## 2022-06-17 DIAGNOSIS — D2271 Melanocytic nevi of right lower limb, including hip: Secondary | ICD-10-CM | POA: Diagnosis not present

## 2022-06-23 ENCOUNTER — Encounter: Payer: Self-pay | Admitting: Obstetrics and Gynecology

## 2022-06-25 ENCOUNTER — Ambulatory Visit (INDEPENDENT_AMBULATORY_CARE_PROVIDER_SITE_OTHER): Payer: BC Managed Care – PPO | Admitting: Obstetrics and Gynecology

## 2022-06-25 VITALS — BP 103/71 | HR 78 | Wt 154.0 lb

## 2022-06-25 DIAGNOSIS — Z3A27 27 weeks gestation of pregnancy: Secondary | ICD-10-CM | POA: Diagnosis not present

## 2022-06-25 DIAGNOSIS — Z3482 Encounter for supervision of other normal pregnancy, second trimester: Secondary | ICD-10-CM

## 2022-06-25 DIAGNOSIS — Z23 Encounter for immunization: Secondary | ICD-10-CM | POA: Diagnosis not present

## 2022-06-25 DIAGNOSIS — E038 Other specified hypothyroidism: Secondary | ICD-10-CM | POA: Diagnosis not present

## 2022-06-25 DIAGNOSIS — Z3480 Encounter for supervision of other normal pregnancy, unspecified trimester: Secondary | ICD-10-CM

## 2022-06-25 NOTE — Progress Notes (Signed)
   PRENATAL VISIT NOTE  Subjective:  Jenny Eaton is a 31 y.o. G2P1001 at 53w2dbeing seen today for ongoing prenatal care.  She is currently monitored for the following issues for this low-risk pregnancy and has Hypothyroidism; Migraines; Irritable bowel syndrome (IBS); Preventative health care; Microscopic hematuria; and Supervision of other normal pregnancy, antepartum on their problem list.  Patient reports no complaints.  Contractions: Not present. Vag. Bleeding: None.  Movement: Present. Denies leaking of fluid.   The following portions of the patient's history were reviewed and updated as appropriate: allergies, current medications, past family history, past medical history, past social history, past surgical history and problem list.   Objective:   Vitals:   06/25/22 0839  BP: 103/71  Pulse: 78  Weight: 154 lb (69.9 kg)    Fetal Status: Fetal Heart Rate (bpm): 130   Movement: Present     General:  Alert, oriented and cooperative. Patient is in no acute distress.  Skin: Skin is warm and dry. No rash noted.   Cardiovascular: Normal heart rate noted  Respiratory: Normal respiratory effort, no problems with respiration noted  Abdomen: Soft, gravid, appropriate for gestational age.  Pain/Pressure: Absent     Pelvic: Cervical exam deferred        Extremities: Normal range of motion.  Edema: None  Mental Status: Normal mood and affect. Normal behavior. Normal judgment and thought content.   Assessment and Plan:  Pregnancy: G2P1001 at 237w2d. [redacted] weeks gestation of pregnancy  2. Supervision of other normal pregnancy, antepartum 28 week labs today Offered and recommended flu shot today - pt accepts. She will get tdap next time.  MSAFP wnl - CBC - Glucose Tolerance, 2 Hours w/1 Hour - HIV Antibody (routine testing w rflx) - RPR  3. Other specified hypothyroidism Check TSH today with next lab draw today. Check TSH one per trimester  Preterm labor symptoms and general  obstetric precautions including but not limited to vaginal bleeding, contractions, leaking of fluid and fetal movement were reviewed in detail with the patient. Please refer to After Visit Summary for other counseling recommendations.   Return in about 2 weeks (around 07/09/2022) for OB VISIT, MD or APP.  Future Appointments  Date Time Provider [place]9/05/2022  8:55 AM DuRadene GunningMD [facility] None  06/28/2022 12:30 PM [facility]/08/2022 12:45 PM [facility] US6 [facility]10/11/2021 12:10 PM Shamleffer, IbMelanie CrazierMD [facility] None    PaRadene GunningMD

## 2022-06-26 LAB — GLUCOSE TOLERANCE, 2 HOURS W/ 1HR
Glucose, 1 hour: 95 mg/dL (ref 70–179)
Glucose, 2 hour: 76 mg/dL (ref 70–152)
Glucose, Fasting: 67 mg/dL — ABNORMAL LOW (ref 70–91)

## 2022-06-26 LAB — CBC
Hematocrit: 34.7 % (ref 34.0–46.6)
Hemoglobin: 11.5 g/dL (ref 11.1–15.9)
MCH: 31 pg (ref 26.6–33.0)
MCHC: 33.1 g/dL (ref 31.5–35.7)
MCV: 94 fL (ref 79–97)
Platelets: 242 10*3/uL (ref 150–450)
RBC: 3.71 x10E6/uL — ABNORMAL LOW (ref 3.77–5.28)
RDW: 11.4 % — ABNORMAL LOW (ref 11.7–15.4)
WBC: 7 10*3/uL (ref 3.4–10.8)

## 2022-06-26 LAB — TSH: TSH: 0.711 u[IU]/mL (ref 0.450–4.500)

## 2022-06-26 LAB — HIV ANTIBODY (ROUTINE TESTING W REFLEX): HIV Screen 4th Generation wRfx: NONREACTIVE

## 2022-06-26 LAB — RPR: RPR Ser Ql: NONREACTIVE

## 2022-06-28 ENCOUNTER — Ambulatory Visit: Payer: BC Managed Care – PPO | Admitting: *Deleted

## 2022-06-28 ENCOUNTER — Ambulatory Visit: Payer: BC Managed Care – PPO | Attending: Obstetrics

## 2022-06-28 VITALS — BP 111/73 | HR 80

## 2022-06-28 DIAGNOSIS — E039 Hypothyroidism, unspecified: Secondary | ICD-10-CM | POA: Diagnosis not present

## 2022-06-28 DIAGNOSIS — O99282 Endocrine, nutritional and metabolic diseases complicating pregnancy, second trimester: Secondary | ICD-10-CM | POA: Insufficient documentation

## 2022-06-28 DIAGNOSIS — Z3A27 27 weeks gestation of pregnancy: Secondary | ICD-10-CM | POA: Diagnosis not present

## 2022-06-28 DIAGNOSIS — Z3480 Encounter for supervision of other normal pregnancy, unspecified trimester: Secondary | ICD-10-CM | POA: Insufficient documentation

## 2022-07-19 ENCOUNTER — Ambulatory Visit: Payer: BC Managed Care – PPO | Admitting: Internal Medicine

## 2022-07-19 ENCOUNTER — Encounter: Payer: Self-pay | Admitting: Internal Medicine

## 2022-07-19 VITALS — BP 106/78 | HR 69 | Ht 65.0 in | Wt 160.6 lb

## 2022-07-19 DIAGNOSIS — E038 Other specified hypothyroidism: Secondary | ICD-10-CM

## 2022-07-19 LAB — TSH: TSH: 0.96 u[IU]/mL (ref 0.35–5.50)

## 2022-07-19 NOTE — Progress Notes (Signed)
Marland Kitchenlogoend   Name: Jenny Eaton  MRN/ DOB: [id number], 01-28-1991    Age/ Sex: 31 y.o., female     PCP: Debbrah Alar, NP   Reason for Endocrinology Evaluation: Hypothyroidism     Initial Endocrinology Clinic Visit: 01/03/2019    PATIENT IDENTIFIER: Jenny Eaton is a 31 y.o., female with a past medical history of migraine headaches and hypothyroidism. She has followed with [facility] since 01/03/2019 for consultative assistance with management of her hypothyroidism.   HISTORICAL SUMMARY: The patient was first diagnosed with hypothyroidism in 2006, she was in 8th grade at the time with hair loss, fatigue and dry eyes.   She has been on levothyroxine 50 mcg daily for a long time , she was also on OCP's but stopped them in October,2019 and started prenatals and that's when her TFT's became abnormal with a TSH of 0.34 uIU/mL. She was advised by her PCP to reduce the dose to 25 mcg with repeat TST of 6.54 uIU/mL.    S/P normal vaginal delivery 10/22/2019 [facility])  SUBJECTIVE:     Today (07/19/2022):  Jenny Eaton is here for a   follow up appointment on her hypothyroidism during pregnancy. She has NOT been seen in 22 months    She is currently at 30.5 weeks of gestation - girl  EDD 12/6/223 Denies nausea or vomiting  She is exhausted  Denies local neck swelling  Continues with constipation   Denies palpitations  Denies tremors   She is compliant with LT-4 replacement , she takes it appropriately.   Levothyroxine 75 mcg daily        HISTORY:  Past Medical History:  Past Medical History:  Diagnosis Date   History of Hashimoto thyroiditis 2010   Hypothyroidism    Microscopic hematuria    negative work up 2019   Migraines    Thyroid disease    hypothyroid   Past Surgical History:  Past Surgical History:  Procedure Laterality Date   excision of vaginal septum  2008   WISDOM TOOTH EXTRACTION  2011   Social History:  reports that she has  never smoked. She has never used smokeless tobacco. She reports that she does not currently use alcohol. She reports that she does not use drugs. Family History:  Family History  Problem Relation Age of Onset   Hyperlipidemia Mother    Hashimoto's thyroiditis Maternal Grandmother    Kidney cancer Maternal Grandmother    Pulmonary embolism Maternal Grandmother    Cancer Maternal Grandmother    Diabetes Maternal Grandfather    Heart disease Maternal Grandfather        CAD- living, ?hx of cabg   Hypertension Maternal Grandfather    Hypertension Paternal Grandmother    Stroke Paternal Grandmother    COPD Neg Hx      HOME MEDICATIONS: Allergies as of 07/19/2022       Reactions   Cephalosporins Hives   Penicillins Hives        Medication List        Accurate as of July 19, 2022  6:58 AM. If you have any questions, ask your nurse or doctor.          albuterol 108 (90 Base) MCG/ACT inhaler Commonly known as: VENTOLIN HFA Inhale 2 puffs into the lungs every 6 (six) hours as needed for wheezing or shortness of breath.   levothyroxine 75 MCG tablet Commonly known as: SYNTHROID Take 1 tablet (75 mcg total) by mouth daily.   PRENATAL  VITAMINS PO Take by mouth.          OBJECTIVE:   PHYSICAL EXAM: VS: BP 106/78 (BP Location: Left Arm, Patient Position: Sitting, Cuff Size: Normal)   Pulse 69   Ht '5\' 5"'$  (1.651 m)   Wt 160 lb 9.6 oz (72.8 kg)   LMP 12/16/2021   SpO2 96%   BMI 26.73 kg/m    EXAM: General: Pt appears well and is in NAD  Neck: General: Supple without adenopathy. Thyroid: Thyroid size normal.  No goiter or nodules appreciated.   Lungs: Clear with good BS bilat   Heart: Auscultation: RRR.  Abdomen: Gravid uterus  Extremities:  BL LE: No pretibial edema normal ROM and strength.  Mental Status: Judgment, insight: Intact Mood and affect: No depression, anxiety, or agitation     DATA REVIEWED:   Latest Reference Range & Units 03/22/22 15:16   TSH 0.35 - 5.50 uIU/mL 1.33     ASSESSMENT / PLAN / RECOMMENDATIONS:   Hypothyroidism , and a pregnant female:  -She is currently at the 3rd  trimester - TSH is within goal  - NO changes today  - Pt advised to change levothyroxine post delivery to 75 mcg , 6 days a week (skipping Sundays) - Pt is clinically euthyroid   Medications   Continue Levothyroxine 75 mcg daily     F/U in 4 months    Signed electronically by: Mack Guise, MD  [facility] End[facility]  [address] [place], [place] [address] Phone: [phone] FAX: [phone]      CC: Debbrah Alar, NP [address] [place]  [address] Phone: [phone]  Fax: [phone]   Return to Endocrinology clinic as below: Future Appointments  Date Time Provider [place]  07/19/2022 12:10 PM Pedro Oldenburg, Melanie Crazier, MD [facility] None  07/22/2022  2:50 PM Truett Mainland, DO [facility] None  08/27/2022  8:15 AM Darliss Cheney, MD [facility] None  09/14/2022  8:15 AM Seabron Spates, CNM [facility] None

## 2022-07-19 NOTE — Patient Instructions (Addendum)
After the baby is born take Levothyroxine 75 mcg ,1 tablet Monday through Saturday      You are on levothyroxine - which is your thyroid hormone supplement. You MUST take this consistently.  You should take this first thing in the morning on an empty stomach with water. You should not take it with other medications. Wait 30mn to 1hr prior to eating. If you are taking any vitamins - please take these in the evening.   If you miss a dose, please take your missed dose the following day (double the dose for that day). You should have a pill box for ONLY levothyroxine on your bedside table to help you remember to take your medications.

## 2022-07-22 ENCOUNTER — Ambulatory Visit (INDEPENDENT_AMBULATORY_CARE_PROVIDER_SITE_OTHER): Payer: BC Managed Care – PPO | Admitting: Family Medicine

## 2022-07-22 VITALS — BP 99/65 | HR 81 | Wt 161.0 lb

## 2022-07-22 DIAGNOSIS — Z23 Encounter for immunization: Secondary | ICD-10-CM | POA: Diagnosis not present

## 2022-07-22 DIAGNOSIS — E038 Other specified hypothyroidism: Secondary | ICD-10-CM

## 2022-07-22 DIAGNOSIS — Z3480 Encounter for supervision of other normal pregnancy, unspecified trimester: Secondary | ICD-10-CM

## 2022-07-22 DIAGNOSIS — Z3483 Encounter for supervision of other normal pregnancy, third trimester: Secondary | ICD-10-CM

## 2022-07-22 DIAGNOSIS — Z3A31 31 weeks gestation of pregnancy: Secondary | ICD-10-CM

## 2022-07-22 NOTE — Progress Notes (Signed)
   PRENATAL VISIT NOTE  Subjective:  Jenny Eaton is a 31 y.o. G2P1001 at 21w1dbeing seen today for ongoing prenatal care.  She is currently monitored for the following issues for this high-risk pregnancy and has Hypothyroidism; Migraines; Irritable bowel syndrome (IBS); Preventative health care; Microscopic hematuria; and Supervision of other normal pregnancy, antepartum on their problem list.  Patient reports no complaints.  Contractions: Not present. Vag. Bleeding: None.  Movement: Present. Denies leaking of fluid.   The following portions of the patient's history were reviewed and updated as appropriate: allergies, current medications, past family history, past medical history, past social history, past surgical history and problem list.   Objective:   Vitals:   07/22/22 1455  BP: 99/65  Pulse: 81  Weight: 161 lb (73 kg)    Fetal Status: Fetal Heart Rate (bpm): 123   Movement: Present     General:  Alert, oriented and cooperative. Patient is in no acute distress.  Skin: Skin is warm and dry. No rash noted.   Cardiovascular: Normal heart rate noted  Respiratory: Normal respiratory effort, no problems with respiration noted  Abdomen: Soft, gravid, appropriate for gestational age.  Pain/Pressure: Absent     Pelvic: Cervical exam deferred        Extremities: Normal range of motion.  Edema: None  Mental Status: Normal mood and affect. Normal behavior. Normal judgment and thought content.   Assessment and Plan:  Pregnancy: G2P1001 at 335w1d. [redacted] weeks gestation of pregnancy - Tdap vaccine greater than or equal to 7yo IM  2. Supervision of other normal pregnancy, antepartum FHT and FH normal Plans on bottle feeding. Desires COC for birth control while waiting for her husband to get a vasectomy Would like to wait until around due date to have baby.  3. Other specified hypothyroidism Euthyroid.  Stable on synthroid. Return to levothyroxine 7531mdaily after  delivery  Preterm labor symptoms and general obstetric precautions including but not limited to vaginal bleeding, contractions, leaking of fluid and fetal movement were reviewed in detail with the patient. Please refer to After Visit Summary for other counseling recommendations.   No follow-ups on file.  Future Appointments  Date Time Provider DepGreentree1/07/2022  8:15 AM AjeDarliss CheneyD [facility] None  09/14/2022  8:15 AM WilSeabron SpatesNM [facility] None  11/23/2022 12:10 PM Shamleffer, IbtMelanie CrazierD [facility] None    JacTruett MainlandO

## 2022-08-04 ENCOUNTER — Encounter: Payer: Self-pay | Admitting: General Practice

## 2022-08-27 ENCOUNTER — Other Ambulatory Visit (HOSPITAL_COMMUNITY)
Admission: RE | Admit: 2022-08-27 | Discharge: 2022-08-27 | Disposition: A | Discharge status: Home / Self Care | Payer: BC Managed Care – PPO | Source: Ambulatory Visit | Attending: Obstetrics and Gynecology | Admitting: Obstetrics and Gynecology

## 2022-08-27 ENCOUNTER — Ambulatory Visit (INDEPENDENT_AMBULATORY_CARE_PROVIDER_SITE_OTHER): Payer: BC Managed Care – PPO | Admitting: Obstetrics and Gynecology

## 2022-08-27 VITALS — BP 108/70 | HR 86 | Wt 167.0 lb

## 2022-08-27 DIAGNOSIS — Z3A36 36 weeks gestation of pregnancy: Secondary | ICD-10-CM | POA: Diagnosis not present

## 2022-08-27 DIAGNOSIS — Z3480 Encounter for supervision of other normal pregnancy, unspecified trimester: Secondary | ICD-10-CM

## 2022-08-27 DIAGNOSIS — Z3483 Encounter for supervision of other normal pregnancy, third trimester: Secondary | ICD-10-CM

## 2022-08-27 NOTE — Progress Notes (Deleted)
    GYNECOLOGY VISIT  Patient name: Guneet Delpino MRN [id number]  Date of birth: Jun 04, 1991 Chief Complaint:   No chief complaint on file.   History:  Bev Drennen is a 31 y.o. G2P1001 being seen today for ***.    Past Medical History:  Diagnosis Date   History of Hashimoto thyroiditis 2010   Hypothyroidism    Microscopic hematuria    negative work up 2019   Migraines    Thyroid disease    hypothyroid    Past Surgical History:  Procedure Laterality Date   excision of vaginal septum  2008   WISDOM TOOTH EXTRACTION  2011    The following portions of the patient's history were reviewed and updated as appropriate: allergies, current medications, past family history, past medical history, past social history, past surgical history and problem list.   Health Maintenance:   Last pap ***. Results were: {Pap findings:25134}. H/O abnormal pap: {yes/yes***/no:23866} Last mammogram: ***. Results were: {normal, abnormal, n/a:23837}. Family h/o breast cancer: {yes***/no:23838}   Review of Systems:  {Ros - complete:30496} Comprehensive review of systems was otherwise negative.   Objective:  Physical Exam LMP 12/16/2021    Physical Exam   Labs and Imaging No results found.     Assessment & Plan:   1. Supervision of other normal pregnancy, antepartum ***  2. [redacted] weeks gestation of pregnancy GBS collected today     *** Routine preventative health maintenance measures emphasized.  Darliss Cheney, MD [facility]

## 2022-08-27 NOTE — Progress Notes (Signed)
   PRENATAL VISIT NOTE  Subjective:  Jenny Eaton is a 31 y.o. G2P1001 at 27w2dbeing seen today for ongoing prenatal care.  She is currently monitored for the following issues for this high-risk pregnancy and has Hypothyroidism; Migraines; Irritable bowel syndrome (IBS); Preventative health care; Microscopic hematuria; and Supervision of other normal pregnancy, antepartum on their problem list.  Patient reports  increased pelvic pressure at night, but overall no complaints .  Contractions: Not present. Vag. Bleeding: None.  Movement: Present. Denies leaking of fluid.   The following portions of the patient's history were reviewed and updated as appropriate: allergies, current medications, past family history, past medical history, past social history, past surgical history and problem list.   Objective:   Vitals:   08/27/22 0821  BP: 108/70  Pulse: 86  Weight: 167 lb (75.8 kg)    Fetal Status: Fetal Heart Rate (bpm): 165   Movement: Present     General:  Alert, oriented and cooperative. Patient is in no acute distress.  Skin: Skin is warm and dry. No rash noted.   Cardiovascular: Normal heart rate noted  Respiratory: Normal respiratory effort, no problems with respiration noted  Abdomen: Soft, gravid, appropriate for gestational age.  Pain/Pressure: Present     Pelvic: Cervical exam deferred       normal appearing external genitalia   Extremities: Normal range of motion.  Edema: None  Mental Status: Normal mood and affect. Normal behavior. Normal judgment and thought content.   Assessment and Plan:  Pregnancy: G2P1001 at 385w2d. Supervision of other normal pregnancy, antepartum Routine GBS collected today.  - Strep Gp B Culture+Rflx - GC/Chlamydia probe amp ([place]. [redacted] weeks gestation of pregnancy - Strep Gp B Culture+Rflx - GC/Chlamydia probe amp ([place])[facility] symptoms and general obstetric precautions including but not  limited to vaginal bleeding, contractions, leaking of fluid and fetal movement were reviewed in detail with the patient. Please refer to After Visit Summary for other counseling recommendations.   No follow-ups on Eaton.  Future Appointments  Date Time Provider DeEverly11/17/2023  8:15 AM AjDarliss CheneyMD [facility] None  09/14/2022  8:15 AM WiSeabron SpatesCNM [facility] None  11/23/2022 12:10 PM Shamleffer, IbMelanie CrazierMD [facility] None    ChDarliss CheneyMD

## 2022-08-30 LAB — GC/CHLAMYDIA PROBE AMP (~~LOC~~) NOT AT ARMC
Chlamydia: NEGATIVE
Comment: NEGATIVE
Comment: NORMAL
Neisseria Gonorrhea: NEGATIVE

## 2022-08-31 LAB — STREP GP B CULTURE+RFLX: Strep Gp B Culture+Rflx: NEGATIVE

## 2022-09-03 ENCOUNTER — Ambulatory Visit (INDEPENDENT_AMBULATORY_CARE_PROVIDER_SITE_OTHER): Payer: BC Managed Care – PPO | Admitting: Obstetrics and Gynecology

## 2022-09-03 VITALS — BP 121/74 | HR 102 | Wt 166.0 lb

## 2022-09-03 DIAGNOSIS — Z3480 Encounter for supervision of other normal pregnancy, unspecified trimester: Secondary | ICD-10-CM

## 2022-09-03 DIAGNOSIS — Z3483 Encounter for supervision of other normal pregnancy, third trimester: Secondary | ICD-10-CM

## 2022-09-03 DIAGNOSIS — E038 Other specified hypothyroidism: Secondary | ICD-10-CM

## 2022-09-03 DIAGNOSIS — Z3A37 37 weeks gestation of pregnancy: Secondary | ICD-10-CM

## 2022-09-03 NOTE — Progress Notes (Signed)
   PRENATAL VISIT NOTE  Subjective:  Jenny Eaton is a 31 y.o. G2P1001 at 60w2dbeing seen today for ongoing prenatal care.  She is currently monitored for the following issues for this low-risk pregnancy and has Hypothyroidism; Migraines; Irritable bowel syndrome (IBS); Preventative health care; Microscopic hematuria; and Supervision of other normal pregnancy, antepartum on their problem list.  Patient reports no complaints.  Contractions: Not present. Vag. Bleeding: None.  Movement: Present. Denies leaking of fluid.   The following portions of the patient's history were reviewed and updated as appropriate: allergies, current medications, past family history, past medical history, past social history, past surgical history and problem list.   Objective:   Vitals:   09/03/22 0814  BP: 121/74  Pulse: (!) 102  Weight: 166 lb (75.3 kg)    Fetal Status: Fetal Heart Rate (bpm): 145 Fundal Height: 37 cm Movement: Present     General:  Alert, oriented and cooperative. Patient is in no acute distress.  Skin: Skin is warm and dry. No rash noted.   Cardiovascular: Normal heart rate noted  Respiratory: Normal respiratory effort, no problems with respiration noted  Abdomen: Soft, gravid, appropriate for gestational age.  Pain/Pressure: Absent     Pelvic: Cervical exam deferred        Extremities: Normal range of motion.  Edema: Trace  Mental Status: Normal mood and affect. Normal behavior. Normal judgment and thought content.   Assessment and Plan:  Pregnancy: G2P1001 at 329w2d. Supervision of other normal pregnancy, antepartum 2. [redacted] weeks gestation of pregnancy Offered membrane strip at followup visit  GBS negative  3. Other specified hypothyroidism Followed by endo  Levo 7560m 6d a week PP  Preterm labor symptoms and general obstetric precautions including but not limited to vaginal bleeding, contractions, leaking of fluid and fetal movement were reviewed in detail with the  patient. Please refer to After Visit Summary for other counseling recommendations.   Return for OFFICE OB VISIT (MD or APP).  Future Appointments  Date Time Provider DepEdenton1/28/2023  8:15 AM WilSeabron SpatesNM [facility] None  11/23/2022 12:10 PM Shamleffer, IbtMelanie CrazierD [facility] None    ChrDarliss CheneyD

## 2022-09-14 ENCOUNTER — Encounter (HOSPITAL_COMMUNITY): Payer: Self-pay | Admitting: *Deleted

## 2022-09-14 ENCOUNTER — Telehealth (HOSPITAL_COMMUNITY): Payer: Self-pay | Admitting: *Deleted

## 2022-09-14 ENCOUNTER — Ambulatory Visit (INDEPENDENT_AMBULATORY_CARE_PROVIDER_SITE_OTHER): Payer: BC Managed Care – PPO | Admitting: Advanced Practice Midwife

## 2022-09-14 ENCOUNTER — Other Ambulatory Visit: Payer: Self-pay | Admitting: Advanced Practice Midwife

## 2022-09-14 ENCOUNTER — Encounter: Payer: Self-pay | Admitting: Advanced Practice Midwife

## 2022-09-14 VITALS — BP 118/80 | HR 83 | Wt 176.0 lb

## 2022-09-14 DIAGNOSIS — E039 Hypothyroidism, unspecified: Secondary | ICD-10-CM

## 2022-09-14 DIAGNOSIS — Z3A38 38 weeks gestation of pregnancy: Secondary | ICD-10-CM

## 2022-09-14 DIAGNOSIS — Z3483 Encounter for supervision of other normal pregnancy, third trimester: Secondary | ICD-10-CM

## 2022-09-14 NOTE — Progress Notes (Signed)
   PRENATAL VISIT NOTE  Subjective:  Jenny Eaton is a 31 y.o. G2P1001 at 8w6dbeing seen today for ongoing prenatal care.  She is currently monitored for the following issues for this high-risk pregnancy and has Hypothyroidism; Migraines; Irritable bowel syndrome (IBS); Preventative health care; Microscopic hematuria; and Supervision of other normal pregnancy, antepartum on their problem list.  Patient reports occasional contractions.  Contractions: Not present. Vag. Bleeding: None.  Movement: Present. Denies leaking of fluid.   The following portions of the patient's history were reviewed and updated as appropriate: allergies, current medications, past family history, past medical history, past social history, past surgical history and problem list.   Objective:   Vitals:   09/14/22 0813  BP: 118/80  Pulse: 83  Weight: 176 lb (79.8 kg)    Fetal Status:     Movement: Present                          FHR 14                       FH 37cm  General:  Alert, oriented and cooperative. Patient is in no acute distress.  Skin: Skin is warm and dry. No rash noted.   Cardiovascular: Normal heart rate noted  Respiratory: Normal respiratory effort, no problems with respiration noted  Abdomen: Soft, gravid, appropriate for gestational age.  Pain/Pressure: Absent     Pelvic: Cervical exam deferred        Extremities: Normal range of motion.  Edema: None  Mental Status: Normal mood and affect. Normal behavior. Normal judgment and thought content.   Assessment and Plan:  Pregnancy: G2P1001 at 369w6d. [redacted] weeks gestation of pregnancy      Scheduled for IOL on 09/21/22 at 0715am      Discussed process of getting her into hospital, possible delays, process of IOL       Declines membrane sweep today      Scheduled for PP visit on 1/18123  2. Hypothyroidism, unspecified type     Followed by Endo     Takes Synthroid 7528mday  Term labor symptoms and general obstetric precautions including  but not limited to vaginal bleeding, contractions, leaking of fluid and fetal movement were reviewed in detail with the patient. Please refer to After Visit Summary for other counseling recommendations.   Return in about 1 week (around 09/21/2022) for HigState Street CorporationFuture Appointments  Date Time Provider DepOwingsville2/02/2022  7:15 AM [facility] None  11/04/2022 10:35 AM StiTruett MainlandO [facility] None  11/23/2022 12:10 PM Shamleffer, IbtMelanie CrazierD [facility] None    MarHansel FeinsteinNM

## 2022-09-14 NOTE — Telephone Encounter (Signed)
Preadmission screen  

## 2022-09-15 ENCOUNTER — Other Ambulatory Visit: Payer: Self-pay | Admitting: Advanced Practice Midwife

## 2022-09-15 DIAGNOSIS — Z3490 Encounter for supervision of normal pregnancy, unspecified, unspecified trimester: Secondary | ICD-10-CM

## 2022-09-20 ENCOUNTER — Encounter (HOSPITAL_COMMUNITY): Payer: Self-pay | Admitting: Obstetrics & Gynecology

## 2022-09-20 ENCOUNTER — Inpatient Hospital Stay (HOSPITAL_COMMUNITY)
Admission: AD | Admit: 2022-09-20 | Discharge: 2022-09-22 | DRG: 807 | Disposition: A | Discharge status: Home / Self Care | Payer: BC Managed Care – PPO | Attending: Family Medicine | Admitting: Family Medicine

## 2022-09-20 DIAGNOSIS — Z3480 Encounter for supervision of other normal pregnancy, unspecified trimester: Secondary | ICD-10-CM

## 2022-09-20 DIAGNOSIS — Z3A39 39 weeks gestation of pregnancy: Secondary | ICD-10-CM | POA: Diagnosis not present

## 2022-09-20 DIAGNOSIS — Z880 Allergy status to penicillin: Secondary | ICD-10-CM | POA: Diagnosis not present

## 2022-09-20 DIAGNOSIS — E039 Hypothyroidism, unspecified: Secondary | ICD-10-CM | POA: Diagnosis present

## 2022-09-20 DIAGNOSIS — O99284 Endocrine, nutritional and metabolic diseases complicating childbirth: Secondary | ICD-10-CM | POA: Diagnosis not present

## 2022-09-20 DIAGNOSIS — Z3490 Encounter for supervision of normal pregnancy, unspecified, unspecified trimester: Secondary | ICD-10-CM | POA: Diagnosis present

## 2022-09-20 DIAGNOSIS — O26893 Other specified pregnancy related conditions, third trimester: Secondary | ICD-10-CM | POA: Diagnosis not present

## 2022-09-20 LAB — CBC
HCT: 35.6 % — ABNORMAL LOW (ref 36.0–46.0)
Hemoglobin: 12.4 g/dL (ref 12.0–15.0)
MCH: 31.8 pg (ref 26.0–34.0)
MCHC: 34.8 g/dL (ref 30.0–36.0)
MCV: 91.3 fL (ref 80.0–100.0)
Platelets: 144 10*3/uL — ABNORMAL LOW (ref 150–400)
RBC: 3.9 MIL/uL (ref 3.87–5.11)
RDW: 13.1 % (ref 11.5–15.5)
WBC: 8 10*3/uL (ref 4.0–10.5)
nRBC: 0 % (ref 0.0–0.2)

## 2022-09-20 NOTE — MAU Note (Signed)
.  Jenny Eaton is a 31 y.o. at 76w5dhere in MAU reporting: CTX since 2030 frequent around 10 mins apart. PT denies VB, LOF, DFM, abnormal discharge, recent intercourse, PIH s/s, and complications in the pregnancy.  GBS neg SVE ukn IOL 09/21/22 Onset of complaint: 2030 Pain score: 8/10 Vitals:   09/20/22 2230  BP: (!) 129/93  Pulse: 89  Temp: 98 F (36.7 C)  SpO2: 99%     FHT:120 Lab orders placed from triage:

## 2022-09-21 ENCOUNTER — Inpatient Hospital Stay (HOSPITAL_COMMUNITY): Payer: BC Managed Care – PPO | Admitting: Anesthesiology

## 2022-09-21 ENCOUNTER — Encounter (HOSPITAL_COMMUNITY): Payer: Self-pay | Admitting: Family Medicine

## 2022-09-21 ENCOUNTER — Encounter: Payer: BC Managed Care – PPO | Admitting: Advanced Practice Midwife

## 2022-09-21 ENCOUNTER — Inpatient Hospital Stay (HOSPITAL_COMMUNITY): Payer: BC Managed Care – PPO

## 2022-09-21 ENCOUNTER — Inpatient Hospital Stay (HOSPITAL_COMMUNITY): Admission: RE | Admit: 2022-09-21 | Payer: BC Managed Care – PPO | Source: Home / Self Care | Admitting: Family Medicine

## 2022-09-21 DIAGNOSIS — Z3490 Encounter for supervision of normal pregnancy, unspecified, unspecified trimester: Secondary | ICD-10-CM | POA: Diagnosis present

## 2022-09-21 DIAGNOSIS — Z3A39 39 weeks gestation of pregnancy: Secondary | ICD-10-CM

## 2022-09-21 DIAGNOSIS — O26893 Other specified pregnancy related conditions, third trimester: Secondary | ICD-10-CM | POA: Diagnosis present

## 2022-09-21 DIAGNOSIS — E039 Hypothyroidism, unspecified: Secondary | ICD-10-CM | POA: Diagnosis present

## 2022-09-21 DIAGNOSIS — Z880 Allergy status to penicillin: Secondary | ICD-10-CM | POA: Diagnosis not present

## 2022-09-21 DIAGNOSIS — O99284 Endocrine, nutritional and metabolic diseases complicating childbirth: Secondary | ICD-10-CM | POA: Diagnosis present

## 2022-09-21 LAB — RPR: RPR Ser Ql: NONREACTIVE

## 2022-09-21 LAB — TYPE AND SCREEN
ABO/RH(D): O POS
Antibody Screen: NEGATIVE

## 2022-09-21 MED ORDER — LACTATED RINGERS IV SOLN: 500.0000 mL | Freq: Once | INTRAVENOUS | Status: AC

## 2022-09-21 MED ORDER — OXYTOCIN BOLUS FROM INFUSION
333.0000 mL | Freq: Once | INTRAVENOUS | Status: AC
  Administered 2022-09-21: 333 mL via INTRAVENOUS

## 2022-09-21 MED ORDER — FENTANYL CITRATE (PF) 100 MCG/2ML IJ SOLN: 100.0000 ug | INTRAMUSCULAR | Status: DC | PRN

## 2022-09-21 MED ORDER — LACTATED RINGERS IV SOLN: 500.0000 mL | INTRAVENOUS | Status: DC | PRN

## 2022-09-21 MED ORDER — IBUPROFEN 600 MG PO TABS
600.0000 mg | ORAL_TABLET | Freq: Four times a day (QID) | ORAL | Status: DC
  Administered 2022-09-21 – 2022-09-22 (×6): 600 mg via ORAL
  Filled 2022-09-21 (×6): qty 1

## 2022-09-21 MED ORDER — EPHEDRINE 5 MG/ML INJ: 10.0000 mg | INTRAVENOUS | Status: DC | PRN

## 2022-09-21 MED ORDER — OXYTOCIN-SODIUM CHLORIDE 30-0.9 UT/500ML-% IV SOLN
2.5000 [IU]/h | INTRAVENOUS | Status: DC
  Filled 2022-09-21: qty 500

## 2022-09-21 MED ORDER — PHENYLEPHRINE 80 MCG/ML (10ML) SYRINGE FOR IV PUSH (FOR BLOOD PRESSURE SUPPORT)
80.0000 ug | PREFILLED_SYRINGE | INTRAVENOUS | Status: DC | PRN
  Filled 2022-09-21: qty 10

## 2022-09-21 MED ORDER — PRENATAL MULTIVITAMIN CH
1.0000 | ORAL_TABLET | Freq: Every day | ORAL | Status: DC
  Administered 2022-09-22: 1 via ORAL
  Filled 2022-09-21: qty 1

## 2022-09-21 MED ORDER — FENTANYL-BUPIVACAINE-NACL 0.5-0.125-0.9 MG/250ML-% EP SOLN
12.0000 mL/h | EPIDURAL | Status: DC | PRN
  Administered 2022-09-21: 12 mL/h via EPIDURAL
  Filled 2022-09-21: qty 250

## 2022-09-21 MED ORDER — OXYCODONE-ACETAMINOPHEN 5-325 MG PO TABS: 1.0000 | ORAL_TABLET | ORAL | Status: DC | PRN

## 2022-09-21 MED ORDER — DIBUCAINE (PERIANAL) 1 % EX OINT: 1.0000 | TOPICAL_OINTMENT | CUTANEOUS | Status: DC | PRN

## 2022-09-21 MED ORDER — MISOPROSTOL 25 MCG QUARTER TABLET: 25.0000 ug | ORAL_TABLET | Freq: Once | ORAL | Status: DC

## 2022-09-21 MED ORDER — SIMETHICONE 80 MG PO CHEW: 80.0000 mg | CHEWABLE_TABLET | ORAL | Status: DC | PRN

## 2022-09-21 MED ORDER — TERBUTALINE SULFATE 1 MG/ML IJ SOLN: 0.2500 mg | Freq: Once | INTRAMUSCULAR | Status: DC | PRN

## 2022-09-21 MED ORDER — OXYCODONE-ACETAMINOPHEN 5-325 MG PO TABS: 2.0000 | ORAL_TABLET | ORAL | Status: DC | PRN

## 2022-09-21 MED ORDER — WITCH HAZEL-GLYCERIN EX PADS: 1.0000 | MEDICATED_PAD | CUTANEOUS | Status: DC | PRN

## 2022-09-21 MED ORDER — ACETAMINOPHEN 325 MG PO TABS: 650.0000 mg | ORAL_TABLET | ORAL | Status: DC | PRN

## 2022-09-21 MED ORDER — ONDANSETRON HCL 4 MG/2ML IJ SOLN: 4.0000 mg | INTRAMUSCULAR | Status: DC | PRN

## 2022-09-21 MED ORDER — ONDANSETRON HCL 4 MG PO TABS: 4.0000 mg | ORAL_TABLET | ORAL | Status: DC | PRN

## 2022-09-21 MED ORDER — MISOPROSTOL 50MCG HALF TABLET: 50.0000 ug | ORAL_TABLET | Freq: Once | ORAL | Status: DC

## 2022-09-21 MED ORDER — DIPHENHYDRAMINE HCL 25 MG PO CAPS: 25.0000 mg | ORAL_CAPSULE | Freq: Four times a day (QID) | ORAL | Status: DC | PRN

## 2022-09-21 MED ORDER — SOD CITRATE-CITRIC ACID 500-334 MG/5ML PO SOLN: 30.0000 mL | ORAL | Status: DC | PRN

## 2022-09-21 MED ORDER — LIDOCAINE HCL (PF) 1 % IJ SOLN
INTRAMUSCULAR | Status: DC | PRN
  Administered 2022-09-21: 5 mL via EPIDURAL
  Administered 2022-09-21: 3 mL via EPIDURAL

## 2022-09-21 MED ORDER — BENZOCAINE-MENTHOL 20-0.5 % EX AERO
1.0000 | INHALATION_SPRAY | CUTANEOUS | Status: DC | PRN
  Administered 2022-09-21: 1 via TOPICAL
  Filled 2022-09-21: qty 56

## 2022-09-21 MED ORDER — SENNOSIDES-DOCUSATE SODIUM 8.6-50 MG PO TABS
2.0000 | ORAL_TABLET | Freq: Every day | ORAL | Status: DC
  Administered 2022-09-22: 2 via ORAL
  Filled 2022-09-21: qty 2

## 2022-09-21 MED ORDER — LACTATED RINGERS IV SOLN: INTRAVENOUS | Status: DC

## 2022-09-21 MED ORDER — COCONUT OIL OIL: 1.0000 | TOPICAL_OIL | Status: DC | PRN

## 2022-09-21 MED ORDER — DIPHENHYDRAMINE HCL 50 MG/ML IJ SOLN: 12.5000 mg | INTRAMUSCULAR | Status: DC | PRN

## 2022-09-21 MED ORDER — LIDOCAINE HCL (PF) 1 % IJ SOLN: 30.0000 mL | INTRAMUSCULAR | Status: DC | PRN

## 2022-09-21 MED ORDER — PHENYLEPHRINE 80 MCG/ML (10ML) SYRINGE FOR IV PUSH (FOR BLOOD PRESSURE SUPPORT): 80.0000 ug | PREFILLED_SYRINGE | INTRAVENOUS | Status: DC | PRN

## 2022-09-21 MED ORDER — ONDANSETRON HCL 4 MG/2ML IJ SOLN: 4.0000 mg | Freq: Four times a day (QID) | INTRAMUSCULAR | Status: DC | PRN

## 2022-09-21 NOTE — Progress Notes (Signed)
Labor Progress Note Jenny Eaton is a 31 y.o. G2P1001 at 101w6dpresented for SOL  S:  Pt now comfortable with epidural, ready for AROM.  O:  BP 109/77 (BP Location: Right Arm)   Pulse 66   Temp 98.1 F (36.7 C) (Oral)   Resp 16   Ht '5\' 5"'$  (1.651 m)   Wt 172 lb 8 oz (78.2 kg)   LMP 12/16/2021   SpO2 100%   BMI 28.71 kg/m  EFM: baseline 115 bpm/ moderate variability/ 15x15 accels/ no decels  Toco/IUPC: q3-571m SVE:8/80/-2 Pitocin: none   A/P: [age].o. G2P1001 3976w6d. Labor: Progressing well, transitioning into 2nd stage 2. FWB: Cat 1 3. Pain: Well-controlled with epidural  AROM performed with clear return of fluid. Cervix very soft and pliable so encouraged throne then side lying to facilitate fetal descent. As soon as pt repositioned to throne she began to feel rectal pressure. Anticipate SVD.  JamGaylan GeroldNM, MSN, IBCCasseltonrtified Nurse Midwife, ConHat Islandoup

## 2022-09-21 NOTE — Anesthesia Procedure Notes (Signed)
Epidural Patient location during procedure: OB Start time: 09/21/2022 2:40 AM End time: 09/21/2022 2:45 AM  Staffing Anesthesiologist: Nilda Simmer, MD Performed: anesthesiologist   Preanesthetic Checklist Completed: patient identified, IV checked, site marked, risks and benefits discussed, surgical consent, monitors and equipment checked, pre-op evaluation and timeout performed  Epidural Patient position: sitting Prep: DuraPrep and site prepped and draped Patient monitoring: continuous pulse ox and blood pressure Approach: midline Location: L3-L4 Injection technique: LOR saline  Needle:  Needle type: Tuohy  Needle gauge: 17 G Needle length: 9 cm and 9 Needle insertion depth: 4 cm Catheter type: closed end flexible Catheter size: 19 Gauge Catheter at skin depth: 8 cm Test dose: negative  Assessment Events: blood not aspirated, injection not painful, no injection resistance, no paresthesia and negative IV test  Additional Notes The patient has requested an epidural for labor pain management. Risks and benefits including, but not limited to, infection, bleeding, local anesthetic toxicity, headache, hypotension, back pain, block failure, etc. were discussed with the patient. The patient expressed understanding and consented to the procedure. I confirmed that the patient has no bleeding disorders and is not taking blood thinners. I confirmed the patient's last platelet count with the nurse. A time-out was performed immediately prior to the procedure. Please see nursing documentation for vital signs. Sterile technique was used throughout the whole procedure. Once LOR achieved, the epidural catheter threaded easily without resistance. Aspiration of the catheter was negative for blood and CSF. The epidural was dosed slowly and an infusion was started.  1 attempt(s)Reason for block:procedure for pain

## 2022-09-21 NOTE — Discharge Summary (Signed)
Postpartum Discharge Summary  Date of Service updated- yes     Patient Name: Jenny Eaton DOB: 01-Jan-1991 MRN: [id number]  Date of admission: 09/20/2022 Delivery date:09/21/2022  Delivering provider: Gaylan Gerold R  Date of discharge: 09/22/2022  Admitting diagnosis: Encounter for elective induction of labor [Z34.90] Intrauterine pregnancy: [redacted]w[redacted]d    Secondary diagnosis:  Principal Problem:   Vaginal delivery Active Problems:   Hypothyroidism   Supervision of other normal pregnancy, antepartum   Encounter for elective induction of labor  Additional problems: none    Discharge diagnosis: Term Pregnancy Delivered                                              Postpartum procedures: none Augmentation: AROM Complications: None  Hospital course: Onset of Labor With Vaginal Delivery      31y.o. yo [id number] 361w6das admitted in Active Labor on 09/20/2022. Labor course was complicated by none  Membrane Rupture Time/Date: 4:04 AM ,09/21/2022   Delivery Method:Vaginal, Spontaneous  Episiotomy: None  Lacerations:  1st degree;Perineal  Patient had a postpartum course was uncomplicated.  She is ambulating, tolerating a regular diet, passing flatus, and urinating well. She is being started on levothyroxine 7534mdaily, 6 days a week, per endocrinology recommendation and will follow up with them outpatient. Patient is discharged home in stable condition on 09/22/22.  Newborn Data: Birth date:09/21/2022  Birth time:5:47 AM  Gender:Female  Living status:Living  Apgars:9 ,10  Weight:3690 g   Magnesium Sulfate received: No BMZ received: No Rhophylac:N/A MMR:N/A T-DaP:Given prenatally Flu: N/A Transfusion:No  Physical exam  Vitals:   09/21/22 0900 09/21/22 1607 09/21/22 2000 09/22/22 0551  BP: 110/75 112/75 116/78 108/80  Pulse: 80 77 84 70  Resp: _0 Temp: 97.8 F (36.6 C) 97.9 F (36.6 C) 98 F (36.7 C) 98.2 F (36.8 C)  TempSrc: Oral Oral Oral Oral   SpO2: 99% 99%  97%  Weight:      Height:       General: alert, cooperative, and no distress Lochia: appropriate Uterine Fundus: firm Incision: N/A DVT Evaluation: No evidence of DVT seen on physical exam. Labs: Lab Results  Component Value Date   WBC 8.0 09/20/2022   HGB 12.4 09/20/2022   HCT 35.6 (L) 09/20/2022   MCV 91.3 09/20/2022   PLT 144 (L) 09/20/2022      Latest Ref Rng & Units 06/17/2021   10:32 AM  CMP  Glucose 65 - 99 mg/dL 88   BUN 6 - 20 mg/dL 10   Creatinine 0.57 - 1.00 mg/dL 0.68   Sodium 134 - 144 mmol/L 139   Potassium 3.5 - 5.2 mmol/L 4.4   Chloride 96 - 106 mmol/L 101   CO2 20 - 29 mmol/L 25   Calcium 8.7 - 10.2 mg/dL 9.8   Total Protein 6.0 - 8.5 g/dL 6.8   Total Bilirubin 0.0 - 1.2 mg/dL 0.4   Alkaline Phos 44 - 121 IU/L 41   AST 0 - 40 IU/L 11   ALT 0 - 32 IU/L 7    Edinburgh Score:    09/21/2022    8:51 AM  Edinburgh Postnatal Depression Scale Screening Tool  I have been able to laugh and see the funny side of things. 0  I have looked forward with enjoyment to things. 0  I  have blamed myself unnecessarily when things went wrong. 0  I have been anxious or worried for no good reason. 0  I have felt scared or panicky for no good reason. 0  Things have been getting on top of me. 1  I have been so unhappy that I have had difficulty sleeping. 0  I have felt sad or miserable. 0  I have been so unhappy that I have been crying. 0  The thought of harming myself has occurred to me. 0  Edinburgh Postnatal Depression Scale Total 1   After visit meds:  Allergies as of 09/22/2022       Reactions   Cephalosporins Hives   Penicillins Hives        Medication List     TAKE these medications    albuterol 108 (90 Base) MCG/ACT inhaler Commonly known as: VENTOLIN HFA Inhale 2 puffs into the lungs every 6 (six) hours as needed for wheezing or shortness of breath.   levothyroxine 75 MCG tablet Commonly known as: SYNTHROID Take 1 tablet (75 mcg  total) by mouth daily. Take 6 days a week What changed: additional instructions   PRENATAL VITAMINS PO Take by mouth.         Discharge home in stable condition Infant Feeding: Bottle Infant Disposition:home with mother Discharge instruction: per After Visit Summary and Postpartum booklet. Activity: Advance as tolerated. Pelvic rest for 6 weeks.  Diet: routine diet Future Appointments: Future Appointments  Date Time Provider Department Center  11/04/2022 10:35 AM Stinson, Jacob J, DO [facility] None  11/23/2022 12:10 PM Shamleffer, Ibtehal Jaralla, MD [facility] None   Follow up Visit: No message sent, PP visit already scheduled.  Chiagoziem Ndulue MD MPH OB Fellow, [facility] [place] 09/22/2022     

## 2022-09-21 NOTE — H&P (Signed)
OBSTETRIC ADMISSION HISTORY AND PHYSICAL  Jenny Eaton is a 31 y.o. female G2P1001 with IUP at 10w6dby LMP presenting for SOL. She reports +FMs, No LOF, no VB, no blurry vision, headaches or peripheral edema, and RUQ pain.  She plans on bottle feeding. She request OCPs at 6wks for birth control. She received her prenatal care at [facility]  Dating: By LMP --->  Estimated Date of Delivery: 09/22/22 Sono: '@[redacted]w[redacted]d'$ , CWD, normal anatomy, cephalic presentation, anterior placenta, 1145g, 45% EFW 2lb 8oz  Prenatal History/Complications: Hypothyroidism (followed by endo) -> discharge on 745m synthroid x6 days per week   Nursing Staff Provider  Office Location [facility]  Dating  LMP  [facility]'$  Traditional    Language  English  Anatomy US[place]complete  Flu Vaccine  06/25/22 Genetic/Carrier Screen  NIPS:   LR female AFP:   neg Horizon: neg 4/4  TDaP Vaccine  07/22/22 Hgb A1C or  GTT Early: N/A Third trimester: Normal (67/95/76)  COVID Vaccine    LAB RESULTS   Rhogam  NA Blood Type O/Positive/-- (05/17 0946)   Baby Feeding Plan Bottle  Antibody Negative (05/17 0946)  Contraception OCPs  Rubella 2.46 (05/17 0946)  Circumcision N/A RPR Non Reactive (09/08 0819)   [place] (05/17 0946[place]   Support Person Thomas(FOB)  HCVAb Negative  Prenatal Classes  HIV Non Reactive (09/08 0819)     BTL Consent  GBS Negative/-- (11/10 0905)  VBAC Consent  Pap 05/2021 NILM       DME Rx '[ ]'$  BP cuff '[ ]'$  Weight Scale Waterbirth  '[ ]'$  Class '[ ]'$  Consent '[ ]'$  CNM visit  PHQ9 & GAD7 [  ] new OB [  ] 28 weeks  [  ] 36 weeks Induction  '[ ]'$  Orders Entered '[ ]'$ Foley Y/N   Past Medical History: Past Medical History:  Diagnosis Date   History of Hashimoto thyroiditis 2010   Hypothyroidism    Microscopic hematuria    negative work up 2019   Migraines    Thyroid disease    hypothyroid   Past Surgical History: Past Surgical History:  Procedure Laterality Date   excision of vaginal septum   2008   WISDOM TOOTH EXTRACTION  2011   Obstetrical History: OB History     Gravida  2   Para  1   Term  1   Preterm  0   AB  0   Living  1      SAB  0   IAB  0   Ectopic  0   Multiple  0   Live Births  1          Social History Social History   Socioeconomic History   Marital status: Married    Spouse name: ThJomaira Darr Number of children: Not on file   Years of education: Not on file   Highest education level: Not on file  Occupational History   Not on file  Tobacco Use   Smoking status: Never   Smokeless tobacco: Never  Vaping Use   Vaping Use: Never used  Substance and Sexual Activity   Alcohol use: Not Currently   Drug use: Never   Sexual activity: Not Currently    Birth control/protection: None  Other Topics Concern   Not on file  Social History Narrative   Married   Daughter DaDarnelle Bosborn 10/22/2019   Works at [employer] manage[employer]re from [employer]  [place]- moved for her husband's job   Completed Bachelors degree   Enjoys Scientist, water quality, cooking, husband is a TEFL teacher- enjoys outdoors   One 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 Strain: Not on [employer] Insecurity: Not on file  Transportation Needs: Not on file  Physical Activity: Not on file  Stress: No Stress Concern Present (04/03/2019)   [employer] [place]    Feeling of Stress : Only a little  Social Connections: Not on file   Family History: Family History  Problem Relation Age of Onset   Hyperlipidemia Mother    Hashimoto's thyroiditis Maternal Grandmother    Kidney cancer Maternal Grandmother    Pulmonary embolism Maternal Grandmother    Cancer Maternal Grandmother    Diabetes Maternal Grandfather    Heart disease Maternal Grandfather        CAD- living, ?hx of cabg   Hypertension Maternal Grandfather    Hypertension Paternal Grandmother    Stroke Paternal Grandmother    COPD Neg  Hx    Allergies: Allergies  Allergen Reactions   Cephalosporins Hives   Penicillins Hives   Medications Prior to Admission  Medication Sig Dispense Refill Last Dose   levothyroxine (SYNTHROID) 75 MCG tablet Take 1 tablet (75 mcg total) by mouth daily. 90 tablet 3 09/20/2022 at 0700   Prenatal Vit-Fe Fumarate-FA (PRENATAL VITAMINS PO) Take by mouth.   09/19/2022   albuterol (VENTOLIN HFA) 108 (90 Base) MCG/ACT inhaler Inhale 2 puffs into the lungs every 6 (six) hours as needed for wheezing or shortness of breath. (Patient not taking: Reported on 08/27/2022) 8 g 0    Review of Systems  All systems reviewed and negative except as stated in HPI  Blood pressure 115/74, pulse 80, temperature 98 F (36.7 C), temperature source Oral, height '5\' 5"'$  (1.651 m), weight 172 lb 8 oz (78.2 kg), last menstrual period 12/16/2021, SpO2 98 %, unknown if currently breastfeeding. General appearance: alert, cooperative, appears stated age, and no distress Lungs: clear to auscultation bilaterally Heart: regular rate and rhythm Abdomen: soft, non-tender; bowel sounds normal Pelvic: normal external female genitalia, no lesions or skin breakdown, bloody show only Extremities: Homans sign is negative, no sign of DVT DTR's normal Presentation: cephalic Fetal monitoring: Baseline: 120 bpm, Variability: Good {> 6 bpm), Accelerations: Reactive, and Decelerations: Absent Uterine activity: Date/time of onset: 09/20/22 at 2000, Frequency: Every 3-4 minutes, Duration: 60 seconds, and Intensity: strong Dilation: 5 Effacement (%): 80 Station: -1 Exam by:: Heywood Bene, RN  Prenatal labs: ABO, Rh: --/--/O POS (12/04 2330) Antibody: NEG (12/04 2330) Rubella: 2.46 (05/17 0946) RPR: Non Reactive (09/08 0819)  HBsAg: Negative (05/17 0946)  HIV: Non Reactive (09/08 0819)  GBS: Negative/-- (11/10 0905)  2hr GTT: Normal Genetic screening: Normal Anatomy US: Normal  Prenatal Transfer Tool  Maternal Diabetes:  No Genetic Screening: Normal Maternal Ultrasounds/Referrals: Normal Fetal Ultrasounds or other Referrals:  None Maternal Substance Abuse:  No Significant Maternal Medications:  None Significant Maternal Lab Results:  Group B Strep negative Number of Prenatal Visits:greater than 3 verified prenatal visits Other Comments:  None  Results for orders placed or performed during the hospital encounter of 09/20/22 (from the past 24 hour(s))  Type and screen [place]   Collection Time: 09/20/22 11:30 PM  Result Value Ref Range   ABO/RH(D) O POS    Antibody Screen NEG    Sample Expiration      09/23/2022,2359  Performed at [facility], [place] [address], [place], [place] [address]   CBC   Collection Time: 09/20/22 11:32 PM  Result Value Ref Range   WBC 8.0 4.0 - 10.5 K/uL   RBC 3.90 3.87 - 5.11 MIL/uL   Hemoglobin 12.4 12.0 - 15.0 g/dL   HCT 35.6 (L) 36.0 - 46.0 %   MCV 91.3 80.0 - 100.0 fL   MCH 31.8 26.0 - 34.0 pg   MCHC 34.8 30.0 - 36.0 g/dL   RDW 13.1 11.5 - 15.5 %   Platelets 144 (L) 150 - 400 K/uL   nRBC 0.0 0.0 - 0.2 %   Patient Active Problem List   Diagnosis Date Noted   Encounter for elective induction of labor 09/21/2022   Supervision of other normal pregnancy, antepartum 03/03/2022   Microscopic hematuria 09/07/2018   Irritable bowel syndrome (IBS) 01/21/2016   Preventative health care 01/21/2016   Hypothyroidism 10/21/2015   Migraines 10/21/2015    Assessment/Plan:  Pieper Kasik is a 31 y.o. G2P1001 at 30w6dhere for SOL  #Labor: Progressing well, pt requests AROM after epidural placement #Pain: Planning epidural, order in #FWB: Cat 1 #ID: GBS neg #MOF: Bottle #MOC: OCPs  #Circ:  N/A  JGabriel Carina CNM  09/21/2022, 1:39 AM

## 2022-09-21 NOTE — Anesthesia Postprocedure Evaluation (Signed)
Anesthesia Post Note  Patient: Jenny Eaton  Procedure(s) Performed: AN AD [place]     Patient location during evaluation: Mother Baby Anesthesia Type: Epidural Level of consciousness: awake and alert Pain management: pain level controlled Vital Signs Assessment: post-procedure vital signs reviewed and stable Respiratory status: spontaneous breathing, nonlabored ventilation and respiratory function stable Cardiovascular status: stable Postop Assessment: no headache, no backache and epidural receding Anesthetic complications: no   No notable events documented.  Last Vitals:  Vitals:   09/21/22 0851 09/21/22 0900  BP: 122/84 110/75  Pulse: 85 80  Resp: 18 18  Temp:  36.6 C  SpO2:  99%    Last Pain:  Vitals:   09/21/22 0900  TempSrc: Oral  PainSc:    Pain Goal:                   Arzell Mcgeehan

## 2022-09-21 NOTE — Anesthesia Preprocedure Evaluation (Signed)
Anesthesia Evaluation  Patient identified by MRN, date of birth, ID band Patient awake    Reviewed: Allergy & Precautions, NPO status , Patient's Chart, lab work & pertinent test results  Airway Mallampati: III  TM Distance: >3 FB Neck ROM: Full    Dental no notable dental hx.    Pulmonary    Pulmonary exam normal breath sounds clear to auscultation       Cardiovascular  Rhythm:Regular Rate:Normal     Neuro/Psych  Headaches    GI/Hepatic   Endo/Other  Hypothyroidism    Renal/GU      Musculoskeletal   Abdominal   Peds  Hematology   Anesthesia Other Findings   Reproductive/Obstetrics (+) Pregnancy                             Anesthesia Physical Anesthesia Plan  ASA: 2  Anesthesia Plan: Epidural   Post-op Pain Management:    Induction:   PONV Risk Score and Plan:   Airway Management Planned:   Additional Equipment:   Intra-op Plan:   Post-operative Plan:   Informed Consent: I have reviewed the patients History and Physical, chart, labs and discussed the procedure including the risks, benefits and alternatives for the proposed anesthesia with the patient or authorized representative who has indicated his/her understanding and acceptance.       Plan Discussed with:   Anesthesia Plan Comments: (I have discussed risks of neuraxial anesthesia including but not limited to infection, bleeding, nerve injury, back pain, headache, seizures, and failure of block. Patient denies bleeding disorders and is not currently anticoagulated. Labs have been reviewed. Risks and benefits discussed. All patient's questions answered.  )       Anesthesia Quick Evaluation

## 2022-09-22 LAB — TSH: TSH: 1.102 u[IU]/mL (ref 0.350–4.500)

## 2022-09-22 MED ORDER — LEVOTHYROXINE SODIUM 75 MCG PO TABS: 75.0000 ug | ORAL_TABLET | Freq: Every day | ORAL | 0 refills | Status: DC

## 2022-09-22 MED ORDER — LEVOTHYROXINE SODIUM 50 MCG PO TABS: 50.0000 ug | ORAL_TABLET | Freq: Every day | ORAL | Status: DC

## 2022-09-22 MED ORDER — LEVOTHYROXINE SODIUM 75 MCG PO TABS
75.0000 ug | ORAL_TABLET | Freq: Every day | ORAL | Status: DC
  Administered 2022-09-22: 75 ug via ORAL
  Filled 2022-09-22: qty 1

## 2022-09-22 NOTE — Progress Notes (Signed)
POSTPARTUM PROGRESS NOTE  Post Partum Day 1  Subjective:  Jenny Eaton is a 31 y.o. [id number] s/p SVD at [redacted]w[redacted]d  No acute events overnight.  Pt denies problems with ambulating, voiding or po intake.  She denies nausea or vomiting.  Pain is well controlled.  She has had flatus. She has not had bowel movement.  Lochia Minimal.   Objective: Blood pressure 108/80, pulse 70, temperature 98.2 F (36.8 C), temperature source Oral, resp. rate 16, height '5\' 5"'$  (1.651 m), weight 78.2 kg, last menstrual period 12/16/2021, SpO2 97 %, unknown if currently breastfeeding.  Physical Exam:  General: alert, cooperative and no distress Chest: no respiratory distress Heart:regular rate, distal pulses intact Abdomen: soft, nontender,  Uterine Fundus: firm, appropriately tender DVT Evaluation: No calf swelling or tenderness Extremities: No edema Skin: warm, dry  Recent Labs    09/20/22 2332  HGB 12.4  HCT 35.6*    Assessment/Plan: Jenny Wymanis a 31y.o. [id number]/p SVD at 374w6d/ hx of hypothyroidism  Synthroid 75 mcg started PPD#1 - Doing well Contraception: OCP's Feeding: Bottle Dispo: Plan for discharge- home in stable condition.   LOS: 1 day   Jenny EmmerMedical Student, CNM 09/22/2022, 7:55 AM

## 2022-09-22 NOTE — Progress Notes (Signed)
Called first call L&D regarding missing synthroid order. Per provider, she will put order in.

## 2022-09-24 DIAGNOSIS — Z6720 Type B blood, Rh positive: Secondary | ICD-10-CM | POA: Diagnosis not present

## 2022-09-24 DIAGNOSIS — Z2911 Encounter for prophylactic immunotherapy for respiratory syncytial virus (RSV): Secondary | ICD-10-CM | POA: Diagnosis not present

## 2022-09-24 DIAGNOSIS — Z00110 Health examination for newborn under 8 days old: Secondary | ICD-10-CM | POA: Diagnosis not present

## 2022-09-29 ENCOUNTER — Telehealth (HOSPITAL_COMMUNITY): Payer: Self-pay | Admitting: *Deleted

## 2022-09-29 NOTE — Telephone Encounter (Signed)
Mom reports feeling good. No concerns about herself at this time. EPDS=2 [facility] score=1) Mom reports baby is doing well. Feeding, peeing, and pooping without difficulty. Safe sleep reviewed. Mom reports no concerns about baby at present.  Odis Hollingshead, RN 09-29-2022 at 10:05am

## 2022-10-04 ENCOUNTER — Encounter: Payer: Self-pay | Admitting: Family

## 2022-10-05 MED ORDER — CIPROFLOXACIN HCL 0.3 % OP SOLN: OPHTHALMIC | 0 refills | Status: DC

## 2022-11-04 ENCOUNTER — Other Ambulatory Visit: Payer: Self-pay

## 2022-11-04 ENCOUNTER — Encounter: Payer: Self-pay | Admitting: Family Medicine

## 2022-11-04 ENCOUNTER — Ambulatory Visit (INDEPENDENT_AMBULATORY_CARE_PROVIDER_SITE_OTHER): Payer: BC Managed Care – PPO | Admitting: Family Medicine

## 2022-11-04 MED ORDER — NORGESTIM-ETH ESTRAD TRIPHASIC 0.18/0.215/0.25 MG-25 MCG PO TABS: 1.0000 | ORAL_TABLET | Freq: Every day | ORAL | 3 refills | Status: DC

## 2022-11-04 NOTE — Progress Notes (Signed)
[place] Partum Visit Note  Jenny Eaton is a 32 y.o. G28P2002 female who presents for a postpartum visit. She is 6 weeks postpartum following a normal spontaneous vaginal delivery.  I have fully reviewed the prenatal and intrapartum course. The delivery was at 39.6 gestational weeks.  Anesthesia: epidural. Postpartum course has been uneventful. Baby is doing well. Baby is feeding by bottle - Similac Advance. Bleeding no bleeding. Bowel function is normal. Bladder function is normal. Patient is not sexually active. Contraception method is OCP (estrogen/progesterone). Postpartum depression screening: negative. Score 0   The pregnancy intention screening data noted above was reviewed. Potential methods of contraception were discussed. The patient elected to proceed with No data recorded.   Edinburgh Postnatal Depression Scale - 11/04/22 1042       Edinburgh Postnatal Depression Scale:  In the Past 7 Days   I have been able to laugh and see the funny side of things. 0    I have looked forward with enjoyment to things. 0    I have blamed myself unnecessarily when things went wrong. 0    I have been anxious or worried for no good reason. 0    I have felt scared or panicky for no good reason. 0    Things have been getting on top of me. 0    I have been so unhappy that I have had difficulty sleeping. 0    I have felt sad or miserable. 0    I have been so unhappy that I have been crying. 0    The thought of harming myself has occurred to me. 0    Edinburgh Postnatal Depression Scale Total 0             Health Maintenance Due  Topic Date Due   COVID-19 Vaccine (4 - 2023-24 season) 06/18/2022    The following portions of the patient's history were reviewed and updated as appropriate: allergies, current medications, past family history, past medical history, past social history, past surgical history, and problem list.  Review of Systems Pertinent items are noted in HPI.  Objective:   BP 103/78   Pulse 97   Ht '5\' 5"'$  (1.651 m)   LMP 12/16/2021   BMI 28.71 kg/m    General:  alert, cooperative, and no distress   Breasts:  not indicated  Lungs: clear to auscultation bilaterally  Heart:  regular rate and rhythm, S1, S2 normal, no murmur, click, rub or gallop  Abdomen: soft, non-tender; bowel sounds normal; no masses,  no organomegaly   Wound N/a  GU exam:  not indicated       Assessment:   1. Postpartum exam   Plan:   Essential components of care per ACOG recommendations:  1.  Mood and well being: Patient with negative depression screening today. Reviewed local resources for support.   2. Infant care and feeding:  -Patient currently breastmilk feeding? No.  -Social determinants of health (SDOH) reviewed in EPIC. No concerns  3. Sexuality, contraception and birth spacing - Patient does not want a pregnancy in the next year.   - Reviewed reproductive life planning. Reviewed contraceptive methods based on pt preferences and effectiveness.  Patient desired Oral Contraceptive today.   - Discussed birth spacing of 18 months  4. Sleep and fatigue -Encouraged family/partner/community support of 4 hrs of uninterrupted sleep to help with mood and fatigue  5. Physical Recovery  - Discussed patients delivery and complications. She describes her labor as good. -  Patient had a Vaginal, no problems at delivery. Patient had a 1st degree laceration. Perineal healing reviewed. Patient expressed understanding - Patient has urinary incontinence? No. - Patient is safe to resume physical and sexual activity  6.  Health Maintenance - HM due items addressed Yes - Last pap smear  Diagnosis  Date Value Ref Range Status  06/17/2021   Final   - Negative for intraepithelial lesion or malignancy (NILM)   Pap smear not done at today's visit.  -Breast Cancer screening indicated? No.   7. Chronic Disease/Pregnancy Condition follow up: None  - PCP follow up  [place] for [place]

## 2022-11-23 ENCOUNTER — Ambulatory Visit: Payer: BC Managed Care – PPO | Admitting: Internal Medicine

## 2022-11-23 ENCOUNTER — Encounter: Payer: Self-pay | Admitting: Internal Medicine

## 2022-11-23 VITALS — BP 114/70 | HR 71 | Ht 65.0 in | Wt 155.0 lb

## 2022-11-23 DIAGNOSIS — E038 Other specified hypothyroidism: Secondary | ICD-10-CM

## 2022-11-23 LAB — TSH: TSH: 2.01 u[IU]/mL (ref 0.35–5.50)

## 2022-11-23 NOTE — Progress Notes (Unsigned)
[place] Kitchenlogoend   Name: Jenny Eaton  MRN/ DOB: [id number], Oct 23, 1990    Age/ Sex: 32 y.o., female     PCP: Debbrah Alar, NP   Reason for Endocrinology Evaluation: Hypothyroidism     Initial Endocrinology Clinic Visit: 01/03/2019    PATIENT IDENTIFIER: [name] is a 32 y.o., female with a past medical history of migraine headaches and hypothyroidism. She has followed with [facility] since 01/03/2019 for consultative assistance with management of her hypothyroidism.   HISTORICAL SUMMARY: The patient was first diagnosed with hypothyroidism in 2006, she was in 8th grade at the time with hair loss, fatigue and dry eyes.   She has been on levothyroxine 50 mcg daily for a long time , she was also on OCP's but stopped them in October,2019 and started prenatals and that's when her TFT's became abnormal with a TSH of 0.34 uIU/mL. She was advised by her PCP to reduce the dose to 25 mcg with repeat TST of 6.54 uIU/mL.    S/P normal vaginal delivery [facility]/22/2019 [facility] S/P normal vaginal delivery 09/21/2022 ([place])  SUBJECTIVE:     Today (11/23/2022):  Jenny Eaton is here for a   follow up appointment on her hypothyroidism during pregnancy.   She is S/P NVD 09/22/2022-  Denies local neck swelling  Denies palpitations  Denies constipation   Denies tremors     Levothyroxine 75 mcg , 6 days a week ( skios Sunday)      HISTORY:  Past Medical History:  Past Medical History:  Diagnosis Date   History of Hashimoto thyroiditis 2010   Hypothyroidism    Microscopic hematuria    negative work up 2019   Migraines    Thyroid disease    hypothyroid   Past Surgical History:  Past Surgical History:  Procedure Laterality Date   excision of vaginal septum  2008   WISDOM TOOTH EXTRACTION  2011   Social History:  reports that she has never smoked. She has never used smokeless tobacco. She reports that she does not currently use alcohol. She reports that she  does not use drugs. Family History:  Family History  Problem Relation Age of Onset   Hyperlipidemia Mother    Hashimoto's thyroiditis Maternal Grandmother    Kidney cancer Maternal Grandmother    Pulmonary embolism Maternal Grandmother    Cancer Maternal Grandmother    Diabetes Maternal Grandfather    Heart disease Maternal Grandfather        CAD- living, ?hx of cabg   Hypertension Maternal Grandfather    Hypertension Paternal Grandmother    Stroke Paternal Grandmother    COPD Neg Hx      HOME MEDICATIONS: Allergies as of 11/23/2022       Reactions   Cephalosporins Hives   Penicillins Hives        Medication List        Accurate as of November 23, 2022  7:24 AM. If you have any questions, ask your nurse or doctor.          ciprofloxacin 0.3 % ophthalmic solution Commonly known as: Ciloxan Administer 1 drop, every 2 hours, while awake, for 2 days. Then 1 drop, every 4 hours, while awake, for the next 5 days.   levothyroxine 75 MCG tablet Commonly known as: SYNTHROID Take 1 tablet (75 mcg total) by mouth daily. Take 6 days a week   Norgestimate-Ethinyl Estradiol Triphasic 0.18/0.215/0.25 MG-25 MCG tab Commonly known as: Ortho Tri-Cyclen Lo Take 1 tablet by mouth  daily.   PRENATAL VITAMINS PO Take by mouth.          OBJECTIVE:   PHYSICAL EXAM: VS: There were no vitals taken for this visit.   EXAM: General: Pt appears well and is in NAD  Neck: General: Supple without adenopathy. Thyroid: Thyroid size normal.  No goiter or nodules appreciated.   Lungs: Clear with good BS bilat   Heart: Auscultation: RRR.  Abdomen: Gravid uterus  Extremities:  BL LE: No pretibial edema normal ROM and strength.  Mental Status: Judgment, insight: Intact Mood and affect: No depression, anxiety, or agitation     DATA REVIEWED:   Latest Reference Range & Units 03/22/22 15:16  TSH 0.35 - 5.50 uIU/mL 1.33     ASSESSMENT / PLAN / RECOMMENDATIONS:    Hypothyroidism:    - TSH is within goal  - NO changes today  - Pt advised to change levothyroxine post delivery to 75 mcg , 6 days a week (skipping Sundays) - Pt is clinically euthyroid   Medications   Continue Levothyroxine 75 mcg daily     F/U in 4 months    Signed electronically by: Mack Guise, MD  [facility]  [address], [place] [address] Phone: [phone] FAX: [phone]      CC: Debbrah Alar, NP [place] [address] [place] [address] Phone: [phone]  Fax: [phone]   Return to Endocrinology clinic as below: Future Appointments  Date Time Provider [place]  11/23/2022 12:10 PM Georgina Krist, Melanie Crazier, MD [facility] None

## 2022-11-24 MED ORDER — LEVOTHYROXINE SODIUM 50 MCG PO TABS: 50.0000 ug | ORAL_TABLET | Freq: Every day | ORAL | 3 refills | Status: DC

## 2023-03-08 ENCOUNTER — Encounter: Payer: Self-pay | Admitting: Family Medicine

## 2023-06-24 DIAGNOSIS — L578 Other skin changes due to chronic exposure to nonionizing radiation: Secondary | ICD-10-CM | POA: Diagnosis not present

## 2023-06-24 DIAGNOSIS — D225 Melanocytic nevi of trunk: Secondary | ICD-10-CM | POA: Diagnosis not present

## 2023-06-24 DIAGNOSIS — D2372 Other benign neoplasm of skin of left lower limb, including hip: Secondary | ICD-10-CM | POA: Diagnosis not present

## 2023-06-24 DIAGNOSIS — D2271 Melanocytic nevi of right lower limb, including hip: Secondary | ICD-10-CM | POA: Diagnosis not present

## 2023-09-05 ENCOUNTER — Encounter: Payer: BC Managed Care – PPO | Admitting: Family

## 2023-09-14 ENCOUNTER — Other Ambulatory Visit: Payer: Self-pay | Admitting: Family Medicine

## 2023-09-14 ENCOUNTER — Encounter: Payer: BC Managed Care – PPO | Admitting: Family

## 2023-09-17 ENCOUNTER — Encounter: Payer: Self-pay | Admitting: Family Medicine

## 2023-09-19 ENCOUNTER — Other Ambulatory Visit: Payer: Self-pay

## 2023-09-19 DIAGNOSIS — Z3040 Encounter for surveillance of contraceptives, unspecified: Secondary | ICD-10-CM

## 2023-09-19 MED ORDER — NORGESTIM-ETH ESTRAD TRIPHASIC 0.18/0.215/0.25 MG-25 MCG PO TABS: 1.0000 | ORAL_TABLET | Freq: Every day | ORAL | 3 refills | Status: DC

## 2023-10-31 ENCOUNTER — Encounter: Payer: Self-pay | Admitting: Family Medicine

## 2023-11-02 ENCOUNTER — Encounter: Payer: Self-pay | Admitting: Family Medicine

## 2023-11-02 ENCOUNTER — Other Ambulatory Visit (HOSPITAL_COMMUNITY)
Admission: RE | Admit: 2023-11-02 | Discharge: 2023-11-02 | Disposition: A | Discharge status: Home / Self Care | Payer: BC Managed Care – PPO | Source: Ambulatory Visit | Attending: Family Medicine | Admitting: Family Medicine

## 2023-11-02 ENCOUNTER — Ambulatory Visit: Payer: BC Managed Care – PPO | Admitting: Family Medicine

## 2023-11-02 VITALS — BP 124/87 | HR 78 | Ht 65.0 in | Wt 129.0 lb

## 2023-11-02 DIAGNOSIS — Z01419 Encounter for gynecological examination (general) (routine) without abnormal findings: Secondary | ICD-10-CM | POA: Insufficient documentation

## 2023-11-02 DIAGNOSIS — N898 Other specified noninflammatory disorders of vagina: Secondary | ICD-10-CM | POA: Insufficient documentation

## 2023-11-02 DIAGNOSIS — Z1339 Encounter for screening examination for other mental health and behavioral disorders: Secondary | ICD-10-CM | POA: Diagnosis not present

## 2023-11-02 DIAGNOSIS — Z3040 Encounter for surveillance of contraceptives, unspecified: Secondary | ICD-10-CM

## 2023-11-02 DIAGNOSIS — N9089 Other specified noninflammatory disorders of vulva and perineum: Secondary | ICD-10-CM | POA: Insufficient documentation

## 2023-11-02 DIAGNOSIS — Q524 Other congenital malformations of vagina: Secondary | ICD-10-CM | POA: Diagnosis not present

## 2023-11-02 MED ORDER — NORGESTIM-ETH ESTRAD TRIPHASIC 0.18/0.215/0.25 MG-25 MCG PO TABS: 1.0000 | ORAL_TABLET | Freq: Every day | ORAL | 3 refills | Status: DC

## 2023-11-02 NOTE — Progress Notes (Signed)
 ANNUAL EXAM Patient name: Jenny Eaton MRN [id number]  Date of birth: 08-25-1991 Chief Complaint:   Annual Exam  History of Present Illness:   Sussan Bohne is a 33 y.o.  [phone]  female  being seen today for a routine annual exam.  Current complaints: has a flap of tissue from the vaginal opening that sticks out past the vulva. This piece of tissue often gets dry and irritated and tender. It also gets in the way of wiping.   Patient's last menstrual period was 11/01/2023 (exact date).   Upstream - 11/02/23 1004       Pregnancy Intention Screening   Does the patient want to become pregnant in the next year? No    Does the patient's partner want to become pregnant in the next year? No    Would the patient like to discuss contraceptive options today? No      Contraception Wrap Up   Current Method Oral Contraceptive             Last pap 05/2021. Results were: NILM w/ HRHPV not done. H/O abnormal pap: no Last mammogram: n/a.     11/02/2023   10:05 AM 08/27/2022    8:23 AM 03/03/2022    9:15 AM 03/02/2021    7:49 AM 02/06/2018    5:31 PM  Depression screen PHQ 2/9  Decreased Interest 0 0 0 0 0  Down, Depressed, Hopeless 0 0 0 0 0  PHQ - 2 Score 0 0 0 0 0  Altered sleeping 0 0 0  0  Tired, decreased energy 0 1 1  1   Change in appetite 0 0 0  0  Feeling bad or failure about yourself  0 0 0  0  Trouble concentrating 0 0 0  0  Moving slowly or fidgety/restless 0 0 0  0  Suicidal thoughts 0 0 0  0  PHQ-9 Score 0 1 1  1   Difficult doing work/chores     Not difficult at all        11/02/2023   10:05 AM 08/27/2022    8:23 AM 03/03/2022    9:15 AM  GAD 7 : Generalized Anxiety Score  Nervous, Anxious, on Edge 0 0 0  Control/stop worrying 0 0 0  Worry too much - different things 0 0 0  Trouble relaxing 0 0 0  Restless 0 0 0  Easily annoyed or irritable 0 0 0  Afraid - awful might happen 0 0 0  Total GAD 7 Score 0 0 0     Review of Systems:   Pertinent items  are noted in HPI Denies any headaches, blurred vision, fatigue, shortness of breath, chest pain, abdominal pain, abnormal vaginal discharge/itching/odor/irritation, problems with periods, bowel movements, urination, or intercourse unless otherwise stated above. Pertinent History Reviewed:  Reviewed past medical,surgical, social and family history.  Reviewed problem list, medications and allergies. Physical Assessment:   Vitals:   11/02/23 1001  BP: 124/87  Pulse: 78  Weight: 129 lb (58.5 kg)  Height: 5\' 5"  (1.651 m)  Body mass index is 21.47 kg/m.        Physical Examination:   General appearance - well appearing, and in no distress  Mental status - alert, oriented to person, place, and time  Psych:  She has a normal mood and affect  Skin - warm and dry, normal color, no suspicious lesions noted  Chest - effort normal, all lung fields clear to auscultation bilaterally  Heart - normal  rate and regular rhythm  Neck:  midline trachea, no thyromegaly or nodules  Breasts - breasts appear normal, no suspicious masses, no skin or nipple changes or axillary nodes  Abdomen - soft, nontender, nondistended, no masses or organomegaly  Pelvic - VULVA: normal appearing vulva with no masses, tenderness or lesions  VAGINA: normal appearing vagina with normal color and discharge, no lesions. There is a thin piece of hymenal tissue arising from the right upper hymenal area that is 4cm in length. It is on a thin, narrow stalk.   CERVIX: normal appearing cervix without discharge or lesions, no CMT  Thin prep pap is done with HR HPV cotesting  UTERUS: uterus is felt to be normal size, shape, consistency and nontender   ADNEXA: No adnexal masses or tenderness noted.  Extremities:  No swelling or varicosities noted  Chaperone present for exam  Assessment & Plan:  1. Encounter for well woman exam with routine gynecological exam (Primary) - Cytology - PAP  2. Abnormality of hymen  Patient is having a  lot of irritation with the hymenal defect that became disrupted after delivery. The area dose protrude out past the vulva, leading to irritation. With the narrow stalk, removal of that tissue would be fairly easy to do in the office under local anesthesia. Will precert with patient's insurance and schedule for procedure. ICD10 Q52.4, CPT 56700  3. Encounter for refill of prescription for contraception - Norgestim-Eth Estrad Triphasic (NORGESTIMATE-ETHINYL ESTRADIOL TRIPHASIC) 0.18/0.215/0.25 MG-25 MCG tab; Take 1 tablet by mouth daily.  Dispense: 90 tablet; Refill: 3    Labs/procedures today: none   No orders of the defined types were placed in this encounter.   Meds: No orders of the defined types were placed in this encounter.   Follow-up: No follow-ups on file.  Alrick Cubbage J Corrine Tillis, DO 11/02/2023 12:37 PM

## 2023-11-04 ENCOUNTER — Encounter: Payer: Self-pay | Admitting: Family Medicine

## 2023-11-04 LAB — CYTOLOGY - PAP
Comment: NEGATIVE
Diagnosis: NEGATIVE
High risk HPV: NEGATIVE

## 2023-11-07 ENCOUNTER — Telehealth: Payer: Self-pay

## 2023-11-07 NOTE — Telephone Encounter (Signed)
Spoke with Damon at [facility] to see if pre-certification was required for 29528 (Revision of the Hymen) in the office and it doesn't not require pre-certification due to be done in office.   Call reference number is [id number].

## 2023-11-21 ENCOUNTER — Other Ambulatory Visit: Payer: Self-pay | Admitting: Internal Medicine

## 2023-11-22 ENCOUNTER — Ambulatory Visit: Payer: BC Managed Care – PPO | Admitting: Family

## 2023-11-22 VITALS — BP 104/70 | HR 86 | Temp 97.7°F | Resp 16 | Ht 65.0 in | Wt 129.0 lb

## 2023-11-22 DIAGNOSIS — J019 Acute sinusitis, unspecified: Secondary | ICD-10-CM | POA: Diagnosis not present

## 2023-11-22 MED ORDER — AZITHROMYCIN 250 MG PO TABS: ORAL_TABLET | ORAL | 0 refills | Status: AC

## 2023-11-22 NOTE — Progress Notes (Signed)
 Subjective:     Patient ID: Jenny Eaton, female    DOB: [birth year], 33 y.o.   MRN: [id number]  Chief Complaint  Patient presents with   Nasal Congestion    Patient has continued to have nasal congestion for about 2 weeks.     HPI  Discussed the use of AI scribe software for clinical note transcription with the patient, who gave verbal consent to proceed.  History of Present Illness   Jenny Eaton is a 33 year old female who presents with persistent symptoms following a flu-like illness.  Approximately two weeks ago, she developed a flu-like illness characterized by fever, chills, and headache, which confined her to bed for three days. These initial symptoms have resolved, but she continues to experience significant fatigue, sinus pressure, drainage, and a persistent cough. The cough exacerbates with physical activity and talking at work, contributing to her feeling particularly run down.  She currently experiences sinus tenderness and takes ibuprofen  upon waking to manage discomfort. No fever or chills are present. She denies any other symptoms.  She recalls a previous episode of bronchitis during her pregnancy in August 2023, which was effectively treated with azithromycin  due to her penicillin allergy.  Her daughter initially contracted the illness from daycare, and subsequently, her husband and baby also became ill. They have since recovered, but she continues to experience lingering symptoms.  She has not received a flu shot this season.          Health Maintenance Due  Topic Date Due   INFLUENZA VACCINE  05/19/2023   COVID-19 Vaccine (4 - 2024-25 season) 06/19/2023    Past Medical History:  Diagnosis Date   History of Hashimoto thyroiditis 2010   Hypothyroidism    Microscopic hematuria    negative work up 2019   Migraines    Thyroid  disease    hypothyroid    Past Surgical History:  Procedure Laterality Date   excision of vaginal septum  2008    WISDOM TOOTH EXTRACTION  2011    Family History  Problem Relation Age of Onset   Hyperlipidemia Mother    Hashimoto's thyroiditis Maternal Grandmother    Kidney cancer Maternal Grandmother    Pulmonary embolism Maternal Grandmother    Cancer Maternal Grandmother    Diabetes Maternal Grandfather    Heart disease Maternal Grandfather        CAD- living, ?hx of cabg   Hypertension Maternal Grandfather    Hypertension Paternal Grandmother    Stroke Paternal Grandmother    COPD Neg Hx     Social History   Socioeconomic History   Marital status: Married    Spouse name: Madelyn Tlatelpa   Number of children: Not on file   Years of education: Not on file   Highest education level: Not on file  Occupational History   Not on file  Tobacco Use   Smoking status: Never   Smokeless tobacco: Never  Vaping Use   Vaping status: Never Used  Substance and Sexual Activity   Alcohol use: Not Currently   Drug use: Never   Sexual activity: Not Currently    Birth control/protection: None  Other Topics Concern   Not on file  Social History Narrative   Married   Daughter Garrison- born 10/22/2019   Works at [employer] here from [employer] - moved for her husband's job   Completed Bachelors degree   Enjoys diplomatic services operational officer, cooking, husband is a journalist, newspaper- enjoys outdoors  One cat   Social Drivers of Corporate Investment Banker Strain: Not on file  Food Insecurity: No Food Insecurity (09/21/2022)   Hunger Vital Sign    Worried About Running Out of Food in the Last Year: Never true    Ran Out of Food in the Last Year: Never true  Transportation Needs: No Transportation Needs (09/21/2022)   PRAPARE - Administrator, Civil Service (Medical): No    Lack of Transportation (Non-Medical): No  Physical Activity: Not on file  Stress: No Stress Concern Present (04/03/2019)   [employer] of Occupational Health - Occupational Stress Questionnaire    Feeling of  Stress : Only a little  Social Connections: Not on file  Intimate Partner Violence: Not on file    Outpatient Medications Prior to Visit  Medication Sig Dispense Refill   levothyroxine  (SYNTHROID ) 50 MCG tablet TAKE 1 TABLET DAILY 90 tablet 0   TRI-LO-MARZIA 0.18/0.215/0.25 MG-25 MCG TABS TAKE 1 TABLET DAILY 84 tablet 3   Norgestim-Eth Estrad Triphasic (NORGESTIMATE-ETHINYL ESTRADIOL TRIPHASIC) 0.18/0.215/0.25 MG-25 MCG tab Take 1 tablet by mouth daily. 90 tablet 3   No facility-administered medications prior to visit.    Allergies  Allergen Reactions   Cephalosporins Hives   Penicillins Hives    ROS See HPI    Objective:    Physical Exam Constitutional:      General: She is not in acute distress.    Appearance: Normal appearance. She is well-developed.  HENT:     Head: Normocephalic and atraumatic.     Right Ear: Tympanic membrane, ear canal and external ear normal.     Left Ear: Tympanic membrane, ear canal and external ear normal.     Mouth/Throat:     Pharynx: No pharyngeal swelling or posterior oropharyngeal erythema.  Eyes:     General: No scleral icterus. Neck:     Thyroid : No thyromegaly.  Cardiovascular:     Rate and Rhythm: Normal rate and regular rhythm.     Heart sounds: Normal heart sounds. No murmur heard. Pulmonary:     Effort: Pulmonary effort is normal. No respiratory distress.     Breath sounds: Normal breath sounds. No wheezing.  Musculoskeletal:     Cervical back: Neck supple.  Skin:    General: Skin is warm and dry.  Neurological:     Mental Status: She is alert and oriented to person, place, and time.  Psychiatric:        Mood and Affect: Mood normal.        Behavior: Behavior normal.        Thought Content: Thought content normal.        Judgment: Judgment normal.      BP 104/70 (BP Location: Right Arm, Patient Position: Sitting, Cuff Size: Small)   Pulse 86   Temp 97.7 F (36.5 C) (Oral)   Resp 16   Ht 5' 5 (1.651 m)   Wt 129  lb (58.5 kg)   LMP 11/01/2023 (Exact Date)   SpO2 100%   BMI 21.47 kg/m  Wt Readings from Last 3 Encounters:  11/22/23 129 lb (58.5 kg)  11/02/23 129 lb (58.5 kg)  11/23/22 155 lb (70.3 kg)       Assessment & Plan:   Problem List Items Addressed This Visit       Unprioritized   Acute sinusitis - Primary   Persistent symptoms of sinus pressure and drainage following a flu-like illness two weeks ago. Tenderness on palpation of  sinuses. -Prescribe Azithromycin  (Z-Pak) given patient's penicillin allergy and previous positive response to this medication.       Relevant Medications   azithromycin  (ZITHROMAX ) 250 MG tablet    I have discontinued Lissete Geibel [name]'s Norgestimate-Ethinyl Estradiol Triphasic. I am also having her start on azithromycin . Additionally, I am having her maintain her Tri-Lo-Marzia and levothyroxine .  Meds ordered this encounter  Medications   azithromycin  (ZITHROMAX ) 250 MG tablet    Sig: Take 2 tablets on day 1, then 1 tablet daily on days 2 through 5    Dispense:  6 tablet    Refill:  0    Supervising Provider:   DOMENICA BLACKBIRD A [[id number]]

## 2023-11-22 NOTE — Assessment & Plan Note (Signed)
Persistent symptoms of sinus pressure and drainage following a flu-like illness two weeks ago. Tenderness on palpation of sinuses. -Prescribe Azithromycin (Z-Pak) given patient's penicillin allergy and previous positive response to this medication.

## 2023-11-22 NOTE — Patient Instructions (Signed)
 VISIT SUMMARY:  Today, we discussed your ongoing symptoms following a recent flu-like illness. You have been experiencing significant fatigue, sinus pressure, drainage, and a persistent cough. We also reviewed your general health maintenance needs.  YOUR PLAN:  -SINUSITIS: Sinusitis is an inflammation or swelling of the tissue lining the sinuses, often following a cold or flu. You are experiencing sinus pressure, drainage, and tenderness. We will treat this with Azithromycin  (Z-Pak) due to your penicillin allergy and your previous positive response to this medication.  -GENERAL HEALTH MAINTENANCE: You missed your annual physical and flu shot. Please schedule your annual physical and remember to get your flu shot next season.  INSTRUCTIONS:  Please follow the prescribed course of Azithromycin  (Z-Pak) as directed. Schedule your annual physical and get your flu shot next season.

## 2023-11-24 ENCOUNTER — Ambulatory Visit: Payer: BC Managed Care – PPO | Admitting: Internal Medicine

## 2023-11-24 ENCOUNTER — Encounter: Payer: Self-pay | Admitting: Internal Medicine

## 2023-11-24 VITALS — BP 104/68 | HR 81 | Ht 65.0 in | Wt 129.0 lb

## 2023-11-24 DIAGNOSIS — E039 Hypothyroidism, unspecified: Secondary | ICD-10-CM

## 2023-11-24 LAB — T4, FREE: Free T4: 1.4 ng/dL (ref 0.8–1.8)

## 2023-11-24 LAB — TSH: TSH: 1.69 m[IU]/L

## 2023-11-24 NOTE — Progress Notes (Signed)
 [facility]   Name: Jenny Eaton  MRN/ DOB: [id number], April 20, 1991    Age/ Sex: 33 y.o., female     PCP: Daryl Setter, NP   Reason for Endocrinology Evaluation: Hypothyroidism     Initial Endocrinology Clinic Visit: 01/03/2019    PATIENT IDENTIFIER: [name] is a 33 y.o., female with a past medical history of migraine headaches and hypothyroidism. She has followed with [facility] since 01/03/2019 for consultative assistance with management of her hypothyroidism.   HISTORICAL SUMMARY: The patient was first diagnosed with hypothyroidism in 2006, she was in 8th grade at the time with hair loss, fatigue and dry eyes.   She has been on levothyroxine  50 mcg daily for a long time , she was also on OCP's but stopped them in October,2019 and started prenatals and that's when her TFT's became abnormal with a TSH of 0.34 uIU/mL. She was advised by her PCP to reduce the dose to 25 mcg with repeat TST of 6.54 uIU/mL.    S/P normal vaginal delivery [facility]/2019 [facility]) S/P normal vaginal delivery 09/21/2022 ([facility])  SUBJECTIVE:     Today (11/24/2023):  Jenny Eaton is here for a   follow up appointment on her hypothyroidism during pregnancy.   Denies local neck swelling  Denies palpitations  Denies constipation or diarrhea  Denies tremors  Energy level is stable  On COC's   Levothyroxine  50 mcg , daily     HISTORY:  Past Medical History:  Past Medical History:  Diagnosis Date   History of Hashimoto thyroiditis 2010   Hypothyroidism    Microscopic hematuria    negative work up 2019   Migraines    Thyroid  disease    hypothyroid   Past Surgical History:  Past Surgical History:  Procedure Laterality Date   excision of vaginal septum  2008   WISDOM TOOTH EXTRACTION  2011   Social History:  reports that she has never smoked. She has never used smokeless tobacco. She reports that she does not currently use alcohol. She reports that she does not use  drugs. Family History:  Family History  Problem Relation Age of Onset   Hyperlipidemia Mother    Hashimoto's thyroiditis Maternal Grandmother    Kidney cancer Maternal Grandmother    Pulmonary embolism Maternal Grandmother    Cancer Maternal Grandmother    Diabetes Maternal Grandfather    Heart disease Maternal Grandfather        CAD- living, ?hx of cabg   Hypertension Maternal Grandfather    Hypertension Paternal Grandmother    Stroke Paternal Grandmother    COPD Neg Hx      HOME MEDICATIONS: Allergies as of 11/24/2023       Reactions   Cephalosporins Hives   Penicillins Hives        Medication List        Accurate as of November 24, 2023  6:53 AM. If you have any questions, ask your nurse or doctor.          azithromycin  250 MG tablet Commonly known as: ZITHROMAX  Take 2 tablets on day 1, then 1 tablet daily on days 2 through 5   levothyroxine  50 MCG tablet Commonly known as: SYNTHROID  TAKE 1 TABLET DAILY   Tri-Lo-Marzia 0.18/0.215/0.25 MG-25 MCG Tabs Generic drug: Norgestimate-Eth Estradiol TAKE 1 TABLET DAILY          OBJECTIVE:   PHYSICAL EXAM: VS: BP 104/68 (BP Location: Left Arm, Patient Position: Sitting, Cuff Size: Small)   Pulse 81  Ht 5' 5 (1.651 m)   Wt 129 lb (58.5 kg)   LMP 11/01/2023 (Exact Date)   SpO2 99%   BMI 21.47 kg/m     EXAM: General: Pt appears well and is in NAD  Neck: General: Supple without adenopathy. Thyroid : Thyroid  size normal.  No goiter or nodules appreciated.   Lungs: Clear with good BS bilat   Heart: Auscultation: RRR.  Abdomen: Gravid uterus  Extremities:  BL LE: No pretibial edema normal ROM and strength.  Mental Status: Judgment, insight: Intact Mood and affect: No depression, anxiety, or agitation     DATA REVIEWED:   Latest Reference Range & Units 11/24/23 09:00  TSH mIU/L 1.69  T4,Free(Direct) 0.8 - 1.8 ng/dL 1.4   ASSESSMENT / PLAN / RECOMMENDATIONS:   Hypothyroidism:   - Pt is  clinically euthyroid  - No local neck swelling  -TFTs remain within normal range  Medications    Continue levothyroxine  50 mcg daily     F/U in 1 yr    Signed electronically by: Stefano Redgie Butts, MD  [facility] Endocrinology  [facility] [address] [place] [place], [place] [address] Phone: [phone] FAX: [phone]      CC: Daryl Setter, NP [address] [facility] [address] [place] [address] Phone: [phone]  Fax: [phone]   Return to Endocrinology clinic as below: Future Appointments  Date Time Provider Department Center  11/24/2023  8:30 AM Ariyon Mittleman, Donell Redgie, MD [facility] None  12/08/2023  9:35 AM Stinson, Jacob J, DO [facility] None  03/21/2024  9:00 AM Daryl Setter, NP [facility]

## 2023-11-24 NOTE — Patient Instructions (Signed)

## 2023-11-25 ENCOUNTER — Encounter: Payer: Self-pay | Admitting: Internal Medicine

## 2023-11-25 MED ORDER — LEVOTHYROXINE SODIUM 50 MCG PO TABS: 50.0000 ug | ORAL_TABLET | Freq: Every day | ORAL | 3 refills | Status: AC

## 2023-12-06 ENCOUNTER — Telehealth: Payer: Self-pay

## 2023-12-06 NOTE — Telephone Encounter (Signed)
Left voicemail for patient to call back. Need to move appointment on 2/20 to first week in march due to inclement weather.

## 2023-12-08 ENCOUNTER — Ambulatory Visit: Payer: BC Managed Care – PPO | Admitting: Family Medicine

## 2023-12-22 ENCOUNTER — Ambulatory Visit: Payer: BC Managed Care – PPO | Admitting: Family Medicine

## 2023-12-22 VITALS — BP 121/79 | HR 72 | Wt 130.0 lb

## 2023-12-22 DIAGNOSIS — Q524 Other congenital malformations of vagina: Secondary | ICD-10-CM

## 2023-12-23 NOTE — Progress Notes (Signed)
 OB/Gyn Procedure note:   Procedure: Revision of hymenal ring (CPT [phone]) Diagnosis: Abnormality of hymen  Risks, benefits discussed with patient. Consent signed.   The area was examined, finding a thin piece of hymenal tissue arising from the right upper hymenal area that is 4cm in length on a thin stalk. This was cleaned with betadine and injected with 2% lidocaine with epinephrine. After ensuring a complete block, the tissue was lifted and excised. Four interrupted sutures of 3-0 vicryl were then placed and hemostasis was achieved. Antibiotic ointment was then placed on top the incision.   Cleaning instructions given to the patient. Place antibiotic ointment 2-3 times daily, use peribottle after urination. F/u in 2 weeks.

## 2024-01-05 ENCOUNTER — Ambulatory Visit: Admitting: Family Medicine

## 2024-01-05 VITALS — BP 111/81 | HR 78 | Wt 125.0 lb

## 2024-01-05 DIAGNOSIS — Z4889 Encounter for other specified surgical aftercare: Secondary | ICD-10-CM | POA: Diagnosis not present

## 2024-01-05 NOTE — Progress Notes (Signed)
   Subjective:    Patient ID: Jenny Eaton, female    DOB: August 15, 1991, 33 y.o.   MRN: [id number]  HPI  Patient seen for follow-up of hymenal ring revision.  She reports that healing is going well.  She has a little bit of discomfort but has immediately noticed improvement due to have skin tag protrusion being removed.  She did have spotting for a after the procedure.  This has resolved.  Swelling has improved  Review of Systems     Objective:   Physical Exam Vitals reviewed. Exam conducted with a chaperone present.  Constitutional:      Appearance: Normal appearance.  Genitourinary:   Neurological:     Mental Status: She is alert.         Assessment & Plan:   1. Encounter for post surgical wound check (Primary) The area appears to be healing well and is flat against the introitus. No protrusion of the hymen remains. Will leave the sutures in place - they should dissolve over the next 2 weeks. Discussed that she can start having sex after stitches come out. At this point, no follow up is needed, unless something comes up or if the sutures aren't fully dissolved in 2-3 weeks.

## 2024-01-22 ENCOUNTER — Encounter: Payer: Self-pay | Admitting: Family Medicine

## 2024-02-17 ENCOUNTER — Encounter: Payer: Self-pay | Admitting: Family

## 2024-02-24 ENCOUNTER — Encounter (HOSPITAL_COMMUNITY): Payer: Self-pay

## 2024-03-02 ENCOUNTER — Ambulatory Visit (INDEPENDENT_AMBULATORY_CARE_PROVIDER_SITE_OTHER): Admitting: Family

## 2024-03-02 ENCOUNTER — Encounter: Payer: Self-pay | Admitting: Family

## 2024-03-02 VITALS — BP 118/80 | HR 78 | Temp 98.2°F | Resp 16 | Ht 65.0 in | Wt 130.0 lb

## 2024-03-02 DIAGNOSIS — E038 Other specified hypothyroidism: Secondary | ICD-10-CM | POA: Diagnosis not present

## 2024-03-02 DIAGNOSIS — K581 Irritable bowel syndrome with constipation: Secondary | ICD-10-CM | POA: Diagnosis not present

## 2024-03-02 DIAGNOSIS — J020 Streptococcal pharyngitis: Secondary | ICD-10-CM | POA: Diagnosis not present

## 2024-03-02 DIAGNOSIS — Z0000 Encounter for general adult medical examination without abnormal findings: Secondary | ICD-10-CM

## 2024-03-02 MED ORDER — AZITHROMYCIN 250 MG PO TABS: ORAL_TABLET | ORAL | 0 refills | Status: AC

## 2024-03-02 NOTE — Progress Notes (Signed)
 Subjective:     Patient ID: Jenny Eaton, female    DOB: Mar 28, 1991, 33 y.o.   MRN: [id number]  Chief Complaint  Patient presents with   Annual Exam    HPI  Discussed the use of AI scribe software for clinical note transcription with the patient, who gave verbal consent to proceed.  History of Present Illness  Jenny Eaton "Jenny Eaton" is a 33 year old female who presents for an annual physical exam and concerns about IBS symptoms.  She experiences alternating constipation and diarrhea, with significant gas and gas pains potentially triggered by dairy. She has not attempted elimination diets or medications for these symptoms. There is a family history of IBS.  She has increased allergy symptoms this year and recently took Zyrtec, which caused drowsiness.  She developed a sore throat today, localized to the left side, without cough or unusual congestion. Her family has had frequent illnesses since her second child started daycare in January.  Diet and exercise are both good overall.     Health Maintenance Due  Topic Date Due   COVID-19 Vaccine (4 - 2024-25 season) 06/19/2023    Past Medical History:  Diagnosis Date   History of Hashimoto thyroiditis 2010   Hypothyroidism    Microscopic hematuria    negative work up 2019   Migraines    Thyroid  disease    hypothyroid    Past Surgical History:  Procedure Laterality Date   excision of vaginal septum  2008   WISDOM TOOTH EXTRACTION  2011    Family History  Problem Relation Age of Onset   Hyperlipidemia Mother    Hashimoto's thyroiditis Maternal Grandmother    Kidney cancer Maternal Grandmother    Pulmonary embolism Maternal Grandmother    Cancer Maternal Grandmother    Diabetes Maternal Grandfather    Heart disease Maternal Grandfather        CAD- living, ?hx of cabg   Hypertension Maternal Grandfather    Hypertension Paternal Grandmother    Stroke Paternal Grandmother    COPD Neg Hx     Social  History   Socioeconomic History   Marital status: Married    Spouse name: Carrisa Keller   Number of children: Not on file   Years of education: Not on file   Highest education level: Not on file  Occupational History   Not on file  Tobacco Use   Smoking status: Never   Smokeless tobacco: Never  Vaping Use   Vaping status: Never Used  Substance and Sexual Activity   Alcohol use: Not Currently   Drug use: Never   Sexual activity: Yes    Birth control/protection: OCP  Other Topics Concern   Not on file  Social History Narrative   Married   Daughter    Raenell Bump- born 10/22/2019   Sophia born 09/21/22   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 [employer] - moved for her husband's job   Completed Bachelors degree   Enjoys Diplomatic Services operational officer, cooking, husband is a Journalist, newspaper- enjoys outdoors   [employer] scientific officer   Social Drivers of Corporate investment banker Strain: Not on file  Food Insecurity: No Food Insecurity (09/21/2022)   Hunger Vital Sign    Worried About Running Out of Food in the Last Year: Never true    Ran Out of Food in the Last Year: Never true  Transportation Needs: No Transportation Needs (09/21/2022)   PRAPARE - Administrator, Civil Service (Medical): No  Lack of Transportation (Non-Medical): No  Physical Activity: Not on file  Stress: No Stress Concern Present (04/03/2019)   [employer] of Occupational Health - Occupational Stress Questionnaire    Feeling of Stress : Only a little  Social Connections: Not on file  Intimate Partner Violence: Not on file    Outpatient Medications Prior to Visit  Medication Sig Dispense Refill   levothyroxine  (SYNTHROID ) 50 MCG tablet Take 1 tablet (50 mcg total) by mouth daily. 90 tablet 3   TRI-LO-MARZIA 0.18/0.215/0.25 MG-25 MCG TABS TAKE 1 TABLET DAILY 84 tablet 3   No facility-administered medications prior to visit.    Allergies  Allergen Reactions   Cephalosporins Hives   Penicillins Hives     Review of Systems  Constitutional:  Negative for weight loss.  HENT:  Negative for ear pain and hearing loss.   Eyes:  Negative for blurred vision.  Respiratory:  Negative for cough.        Sore throat on left  Cardiovascular:  Negative for leg swelling.  Gastrointestinal:  Positive for constipation and diarrhea.  Genitourinary:  Negative for dysuria and frequency.  Musculoskeletal:  Negative for joint pain and myalgias.  Skin:  Negative for rash.  Neurological:  Negative for headaches.  Psychiatric/Behavioral:         Denies depression/anxiety       Objective:     Physical Exam   BP 118/80 (BP Location: Right Arm, Patient Position: Sitting, Cuff Size: Normal)   Pulse 78   Temp 98.2 F (36.8 C) (Oral)   Resp 16   Ht 5\' 5"  (1.651 m)   Wt 130 lb (59 kg)   SpO2 100%   BMI 21.63 kg/m  Wt Readings from Last 3 Encounters:  03/02/24 130 lb (59 kg)  01/05/24 125 lb (56.7 kg)  12/22/23 130 lb (59 kg)   Physical Exam  Constitutional: She is oriented to person, place, and time. She appears well-developed and well-nourished. No distress.  HENT:  Head: Normocephalic and atraumatic.  Right Ear: Tympanic membrane and ear canal normal.  Left Ear: Tympanic membrane and ear canal normal.  Mouth/Throat: Oropharynx is erythematous without exudate or tonsilar enlargement Eyes: Pupils are equal, round, and reactive to light. No scleral icterus.  Neck: Normal range of motion. No thyromegaly present.  Cardiovascular: Normal rate and regular rhythm.   No murmur heard. Pulmonary/Chest: Effort normal and breath sounds normal. No respiratory distress. He has no wheezes. She has no rales. She exhibits no tenderness.  Abdominal: Soft. Bowel sounds are normal. She exhibits no distension and no mass. There is no tenderness. There is no rebound and no guarding.  Musculoskeletal: She exhibits no edema.  Lymphadenopathy:    She has no cervical adenopathy.  Neurological: She is alert and  oriented to person, place, and time. She has normal patellar reflexes. She exhibits normal muscle tone. Coordination normal.  Skin: Skin is warm and dry.  Psychiatric: She has a normal mood and affect. Her behavior is normal. Judgment and thought content normal.  Breast/Pelvic:  deferred         Assessment & Plan:       Assessment & Plan:   Problem List Items Addressed This Visit       Unprioritized   Strep throat   Rapid strep is +.  She has pen and cephalosporin allergy. Rx sent for azithromycin .      Relevant Medications   azithromycin  (ZITHROMAX ) 250 MG tablet   Preventative health care - Primary  Continue healthy diet, exercise.  Pap, immunizations up to date.      Irritable bowel syndrome (IBS)   We discussed trial off of dairy to see if this helps with gas/bloating.  If not, could try low fodmap diet.      Hypothyroidism   Managed with synthroid  by endocrinology.       I am having Jenny Eaton "Jenny Eaton" start on azithromycin . I am also having her maintain her Tri-Lo-Marzia and levothyroxine .  Meds ordered this encounter  Medications   azithromycin  (ZITHROMAX ) 250 MG tablet    Sig: Take 2 tablets on day 1, then 1 tablet daily on days 2 through 5    Dispense:  6 tablet    Refill:  0    Supervising Provider:   Randie Bustle A [[id number]]

## 2024-03-04 DIAGNOSIS — J020 Streptococcal pharyngitis: Secondary | ICD-10-CM | POA: Insufficient documentation

## 2024-03-04 NOTE — Assessment & Plan Note (Signed)
 Rapid strep is +.  She has pen and cephalosporin allergy. Rx sent for azithromycin .

## 2024-03-04 NOTE — Assessment & Plan Note (Signed)
 Continue healthy diet, exercise.  Pap, immunizations up to date.

## 2024-03-04 NOTE — Telephone Encounter (Signed)
 Late entry- spoke to pt on Friday evening- rx sent Friday evening.

## 2024-03-04 NOTE — Assessment & Plan Note (Signed)
 We discussed trial off of dairy to see if this helps with gas/bloating.  If not, could try low fodmap diet.

## 2024-03-04 NOTE — Patient Instructions (Signed)
 VISIT SUMMARY:  Today, you came in for your annual physical exam and discussed concerns about IBS symptoms, increased allergy symptoms, and a sore throat.  YOUR PLAN:  SORE THROAT: You have an acute sore throat on the left side, which is due to strep.  Please begin aithromycin. -We will order a strep test to determine if it is strep throat. -In the meantime, you can do salt water gargles to help relieve the soreness.  LACTOSE INTOLERANCE: You may have lactose intolerance, which is causing gas and abdominal pain. -Try eliminating dairy from your diet to see if your symptoms improve. -If your symptoms get better, you can consider using lactase supplements or lactose-free products. -If your symptoms persist, we can discuss a low FODMAP diet.  GENERAL HEALTH MAINTENANCE: Your immunizations are up to date. -We recommend getting a flu vaccine and a COVID booster in the fall.

## 2024-03-04 NOTE — Assessment & Plan Note (Signed)
 Managed with synthroid  by endocrinology.

## 2024-03-06 LAB — POCT RAPID STREP A (OFFICE): Rapid Strep A Screen: POSITIVE — AB

## 2024-03-06 NOTE — Addendum Note (Signed)
 Addended by: Joye Nobles on: 03/06/2024 12:57 PM   Modules accepted: Orders

## 2024-03-21 ENCOUNTER — Encounter: Payer: BC Managed Care – PPO | Admitting: Family

## 2024-03-25 ENCOUNTER — Ambulatory Visit
Admission: EM | Admit: 2024-03-25 | Discharge: 2024-03-25 | Disposition: A | Discharge status: Home / Self Care | Attending: Family Medicine | Admitting: Family Medicine

## 2024-03-25 DIAGNOSIS — J020 Streptococcal pharyngitis: Secondary | ICD-10-CM | POA: Diagnosis not present

## 2024-03-25 LAB — POCT RAPID STREP A (OFFICE): Rapid Strep A Screen: POSITIVE — AB

## 2024-03-25 MED ORDER — CLINDAMYCIN HCL 300 MG PO CAPS: 300.0000 mg | ORAL_CAPSULE | Freq: Three times a day (TID) | ORAL | 0 refills | Status: AC

## 2024-03-25 NOTE — ED Provider Notes (Signed)
 [facility]  Note:  This document was prepared using Conservation officer, historic buildings and may include unintentional dictation errors.  MRN: [id number] DOB: 05/19/1991  Subjective:   Jenny Eaton is a 33 y.o. female presenting for 2-day history of recurrent throat pain, painful swallowing.  Patient recently completed treatment for strep throat about 3 weeks ago.  Took a Z-Pak.  Unfortunately one of her children got sick again with strep and she is concerned she has it again.  No respiratory symptoms.  No current facility-administered medications for this encounter.  Current Outpatient Medications:    levothyroxine  (SYNTHROID ) 50 MCG tablet, Take 1 tablet (50 mcg total) by mouth daily., Disp: 90 tablet, Rfl: 3   TRI-LO-MARZIA 0.18/0.215/0.25 MG-25 MCG TABS, TAKE 1 TABLET DAILY, Disp: 84 tablet, Rfl: 3   Allergies  Allergen Reactions   Cephalosporins Hives   Penicillins Hives    Past Medical History:  Diagnosis Date   History of Hashimoto thyroiditis 2010   Hypothyroidism    Microscopic hematuria    negative work up 2019   Migraines    Thyroid  disease    hypothyroid     Past Surgical History:  Procedure Laterality Date   excision of vaginal septum  2008   WISDOM TOOTH EXTRACTION  2011    Family History  Problem Relation Age of Onset   Hyperlipidemia Mother    Hashimoto's thyroiditis Maternal Grandmother    Kidney cancer Maternal Grandmother    Pulmonary embolism Maternal Grandmother    Cancer Maternal Grandmother    Diabetes Maternal Grandfather    Heart disease Maternal Grandfather        CAD- living, ?hx of cabg   Hypertension Maternal Grandfather    Hypertension Paternal Grandmother    Stroke Paternal Grandmother    COPD Neg Hx     Social History   Tobacco Use   Smoking status: Never   Smokeless tobacco: Never  Vaping Use   Vaping status: Never Used  Substance Use Topics   Alcohol use: Not Currently   Drug use: Never     ROS   Objective:   Vitals: BP 124/78 (BP Location: Right Arm)   Pulse 95   Temp 97.9 F (36.6 C) (Oral)   Resp 16   LMP 03/21/2024   SpO2 98%   Physical Exam Constitutional:      General: She is not in acute distress.    Appearance: Normal appearance. She is well-developed. She is not ill-appearing, toxic-appearing or diaphoretic.  HENT:     Head: Normocephalic and atraumatic.     Nose: Nose normal.     Mouth/Throat:     Mouth: Mucous membranes are moist.     Pharynx: Oropharyngeal exudate (left sided) and posterior oropharyngeal erythema (bilateral) present. No pharyngeal swelling or uvula swelling.     Tonsils: No tonsillar exudate or tonsillar abscesses. 0 on the right. 0 on the left.  Eyes:     General: No scleral icterus.       Right eye: No discharge.        Left eye: No discharge.     Extraocular Movements: Extraocular movements intact.  Cardiovascular:     Rate and Rhythm: Normal rate.  Pulmonary:     Effort: Pulmonary effort is normal.  Skin:    General: Skin is warm and dry.  Neurological:     General: No focal deficit present.     Mental Status: She is alert and oriented to person, place, and  time.  Psychiatric:        Mood and Affect: Mood normal.        Behavior: Behavior normal.     Results for orders placed or performed during the hospital encounter of 03/25/24 (from the past 24 hours)  POCT rapid strep A     Status: Abnormal   Collection Time: 03/25/24  8:35 AM  Result Value Ref Range   Rapid Strep A Screen Positive (A) Negative    Assessment and Plan :   PDMP not reviewed this encounter.  1. Strep pharyngitis    Will treat for strep pharyngitis.  Patient is to start clindamycin, use supportive care otherwise. Counseled patient on potential for adverse effects with medications prescribed/recommended today, ER and return-to-clinic precautions discussed, patient verbalized understanding.    Adolph Hoop, [place] 03/25/24 [phone]

## 2024-03-25 NOTE — ED Triage Notes (Signed)
 Pt c/o sore throat x 2 days-pt +strep ~3 weeks ago/completed abx-NAD-steady gait

## 2024-04-04 ENCOUNTER — Other Ambulatory Visit: Payer: Self-pay

## 2024-04-04 ENCOUNTER — Encounter: Payer: Self-pay | Admitting: Family

## 2024-04-04 DIAGNOSIS — R300 Dysuria: Secondary | ICD-10-CM

## 2024-04-04 NOTE — Telephone Encounter (Signed)
 Patient will be here tomorrow am for ua dip and culture

## 2024-04-04 NOTE — Telephone Encounter (Signed)
 Please ask her to come by for UA and culture.

## 2024-04-05 ENCOUNTER — Ambulatory Visit: Payer: Self-pay | Admitting: Family

## 2024-04-05 ENCOUNTER — Other Ambulatory Visit (INDEPENDENT_AMBULATORY_CARE_PROVIDER_SITE_OTHER)

## 2024-04-05 DIAGNOSIS — R300 Dysuria: Secondary | ICD-10-CM

## 2024-04-05 LAB — POC URINALSYSI DIPSTICK (AUTOMATED)
Glucose, UA: NEGATIVE
Ketones, UA: NEGATIVE
Nitrite, UA: POSITIVE
Protein, UA: NEGATIVE
Spec Grav, UA: <=1.005 — AB (ref 1.010–1.025)
Urobilinogen, UA: 0.2 U/dL
pH, UA: 6 (ref 5.0–8.0)

## 2024-04-05 MED ORDER — NITROFURANTOIN MONOHYD MACRO 100 MG PO CAPS: 100.0000 mg | ORAL_CAPSULE | Freq: Two times a day (BID) | ORAL | 0 refills | Status: DC

## 2024-04-06 LAB — URINE CULTURE
MICRO NUMBER:: 16601169
SPECIMEN QUALITY:: ADEQUATE

## 2024-04-12 ENCOUNTER — Other Ambulatory Visit: Payer: Self-pay

## 2024-04-12 ENCOUNTER — Ambulatory Visit: Payer: Self-pay | Admitting: Family

## 2024-04-12 ENCOUNTER — Other Ambulatory Visit (INDEPENDENT_AMBULATORY_CARE_PROVIDER_SITE_OTHER)

## 2024-04-12 DIAGNOSIS — R350 Frequency of micturition: Secondary | ICD-10-CM

## 2024-04-12 DIAGNOSIS — N3000 Acute cystitis without hematuria: Secondary | ICD-10-CM

## 2024-04-12 LAB — POC URINALSYSI DIPSTICK (AUTOMATED)
Glucose, UA: NEGATIVE
Ketones, UA: NEGATIVE
Nitrite, UA: POSITIVE
Protein, UA: NEGATIVE
Spec Grav, UA: 1.01 (ref 1.010–1.025)
Urobilinogen, UA: 0.2 U/dL
pH, UA: 6 (ref 5.0–8.0)

## 2024-04-12 MED ORDER — CIPROFLOXACIN HCL 500 MG PO TABS: 500.0000 mg | ORAL_TABLET | Freq: Two times a day (BID) | ORAL | 0 refills | Status: AC

## 2024-04-12 NOTE — Addendum Note (Signed)
 Addended by: ELOUISE POWELL HERO on: 04/12/2024 04:32 PM   Modules accepted: Orders

## 2024-04-12 NOTE — Telephone Encounter (Signed)
 Please have her come in for UA and culture.

## 2024-04-13 LAB — URINE CULTURE
MICRO NUMBER:: 16629246
SPECIMEN QUALITY:: ADEQUATE

## 2024-08-13 ENCOUNTER — Encounter: Payer: Self-pay | Admitting: Family

## 2024-08-15 ENCOUNTER — Telehealth (INDEPENDENT_AMBULATORY_CARE_PROVIDER_SITE_OTHER): Admitting: Family

## 2024-08-15 DIAGNOSIS — H10021 Other mucopurulent conjunctivitis, right eye: Secondary | ICD-10-CM

## 2024-08-15 MED ORDER — POLYMYXIN B-TRIMETHOPRIM 10000-0.1 UNIT/ML-% OP SOLN: 1.0000 [drp] | Freq: Four times a day (QID) | OPHTHALMIC | 0 refills | Status: AC

## 2024-08-15 NOTE — Progress Notes (Signed)
 MyChart Video Visit    Virtual Visit via Video Note   This format is felt to be most appropriate for this patient at this time. Physical exam was limited by quality of the video and audio technology used for the visit.    Patient location: home Provider location: [facility] Persons involved in the visit: patient, provider  I discussed the limitations of evaluation and management by telemedicine and the availability of in person appointments. The patient expressed understanding and agreed to proceed.  Patient: Jenny Eaton   DOB: 11/21/1990   33 y.o. Female  MRN: [id number] Visit Date: 08/15/2024  Today's healthcare provider: Eleanor GORMAN Ponto, NP   Chief Complaint  Patient presents with   Conjunctivitis    Complains of right eye redness and irritation. Daughter diagnosed with pink eye Sunday.     Subjective:    HPI  Discussed the use of AI scribe software for clinical note transcription with the patient, who gave verbal consent to proceed.  History of Present Illness Jenny Eaton is a 33 year old female who presents with symptoms of conjunctivitis.  Symptoms began on Monday with irritation in the right eye, which worsened by Tuesday. By Wednesday morning, there was noticeable crusting and redness in the affected eye. She has experienced conjunctivitis before and describes this episode as mild compared to previous occurrences.  Her oldest daughter developed symptoms of conjunctivitis on Friday and was prescribed eye drops on Sunday, which led to improvement in her condition.  She is not currently using any medication for this condition.   ROS  See HPI Objective:    There were no vitals taken for this visit.   Physical Exam    Gen: Awake, alert, no acute distress Eyes:  R sclera injected, no obvious discharge/crusting, left eye appears normal. Resp: Breathing is even and  non-labored Psych: calm/pleasant demeanor Neuro: Alert and Oriented x 3, + facial symmetry, speech is clear.    Assessment & Plan:    Problem List Items Addressed This Visit   None Visit Diagnoses       Pink eye disease of right eye    -  Primary   Relevant Medications   trimethoprim-polymyxin b (POLYTRIM) ophthalmic solution        Assessment & Plan Acute conjunctivitis, right eye Mild condition with crusting and redness. - Prescribed eye drops for both eyes for 5 days to prevent cross-contamination. - Advised warm compresses for symptom relief. - Instructed to report if no improvement in 3-4 days or if symptoms worsen.    Meds ordered this encounter  Medications   trimethoprim-polymyxin b (POLYTRIM) ophthalmic solution    Sig: Place 1 drop into both eyes every 6 (six) hours for 5 days.    Dispense:  10 mL    Refill:  0    Supervising Provider:   DOMENICA BLACKBIRD A [[id number]]     No follow-ups on file.     I discussed the assessment and treatment plan with the patient. The patient was provided an opportunity to ask questions and all were answered. The patient agreed with the plan and demonstrated an understanding of the instructions.   The patient was advised to call back or seek an in-person evaluation if the symptoms worsen or if the condition fails to improve as anticipated.  Latarsha Zani S O'Sullivan, NP [facility]

## 2024-08-15 NOTE — Patient Instructions (Signed)
  VISIT SUMMARY: Today, you were seen for symptoms of conjunctivitis in your right eye, which started with irritation and progressed to crusting and redness. You mentioned that your daughter recently had conjunctivitis and improved with eye drops.  YOUR PLAN: -ACUTE CONJUNCTIVITIS, RIGHT EYE: Conjunctivitis, also known as pink eye, is an inflammation or infection of the outer membrane of the eyeball and the inner eyelid. You have been prescribed eye drops to use in both eyes for 5 days to prevent the infection from spreading. Additionally, you should use warm compresses to help relieve your symptoms. Please report back if there is no improvement in 3-4 days or if your symptoms worsen.  INSTRUCTIONS: Please use the prescribed eye drops in both eyes for 5 days. Apply warm compresses to your eyes to help with the symptoms. If you do not see any improvement in 3-4 days or if your symptoms get worse, contact us  immediately.

## 2024-08-21 DIAGNOSIS — L7211 Pilar cyst: Secondary | ICD-10-CM | POA: Diagnosis not present

## 2024-08-21 DIAGNOSIS — Z23 Encounter for immunization: Secondary | ICD-10-CM | POA: Diagnosis not present

## 2024-08-21 DIAGNOSIS — B360 Pityriasis versicolor: Secondary | ICD-10-CM | POA: Diagnosis not present

## 2024-08-21 DIAGNOSIS — L814 Other melanin hyperpigmentation: Secondary | ICD-10-CM | POA: Diagnosis not present

## 2024-08-21 DIAGNOSIS — L578 Other skin changes due to chronic exposure to nonionizing radiation: Secondary | ICD-10-CM | POA: Diagnosis not present

## 2024-10-24 ENCOUNTER — Other Ambulatory Visit: Payer: Self-pay | Admitting: Family Medicine

## 2024-11-23 ENCOUNTER — Encounter: Payer: Self-pay | Admitting: Internal Medicine

## 2024-11-23 ENCOUNTER — Other Ambulatory Visit

## 2024-11-23 ENCOUNTER — Ambulatory Visit: Payer: BC Managed Care – PPO | Admitting: Internal Medicine

## 2024-11-23 VITALS — BP 116/72 | HR 77 | Ht 65.0 in | Wt 127.0 lb

## 2024-11-23 DIAGNOSIS — E039 Hypothyroidism, unspecified: Secondary | ICD-10-CM

## 2024-11-23 LAB — T4, FREE: Free T4: 1.4 ng/dL (ref 0.8–1.8)

## 2024-11-23 LAB — TSH: TSH: 1.36 m[IU]/L

## 2024-11-23 NOTE — Progress Notes (Unsigned)
 [facility]   Name: Jenny Eaton  MRN/ DOB: [id number], 07/31/1991    Age/ Sex: 34 y.o., female     PCP: Daryl Setter, NP   Reason for Endocrinology Evaluation: Hypothyroidism     Initial Endocrinology Clinic Visit: 01/03/2019    PATIENT IDENTIFIER: Jenny Eaton is a 34 y.o., female with a past medical history of migraine headaches and hypothyroidism. She has followed with [facility] since 01/03/2019 for consultative assistance with management of her hypothyroidism.   HISTORICAL SUMMARY: The patient was first diagnosed with hypothyroidism in 2006, she was in 8th grade at the time with hair loss, fatigue and dry eyes.   She has been on levothyroxine  50 mcg daily for a long time , she was also on OCP's but stopped them in October,2019 and started prenatals and that's when her TFT's became abnormal with a TSH of 0.34 uIU/mL. She was advised by her PCP to reduce the dose to 25 mcg with repeat TST of 6.54 uIU/mL.    S/P normal vaginal delivery 10/22/2019 Jenny Eaton) S/P normal vaginal delivery 09/21/2022 (Jenny Eaton)  SUBJECTIVE:     Today (11/23/2024):  [name] is here for a   follow up appointment on her hypothyroidism during pregnancy.    Weight overall stable No local neck swelling  No palpitations  No constipation or diarrhea  No tremors  On COC's  No Biotin   Levothyroxine  50 mcg , daily     HISTORY:  Past Medical History:  Past Medical History:  Diagnosis Date   History of Hashimoto thyroiditis 2010   Hypothyroidism    Microscopic hematuria    negative work up 2019   Migraines    Thyroid  disease    hypothyroid   Past Surgical History:  Past Surgical History:  Procedure Laterality Date   excision of vaginal septum  2008   WISDOM TOOTH EXTRACTION  2011   Social History:  reports that she has never smoked. She has never used smokeless tobacco. She reports that she does not currently use alcohol. She reports that she does not use  drugs. Family History:  Family History  Problem Relation Age of Onset   Hyperlipidemia Mother    Hashimoto's thyroiditis Maternal Grandmother    Kidney cancer Maternal Grandmother    Pulmonary embolism Maternal Grandmother    Cancer Maternal Grandmother    Diabetes Maternal Grandfather    Heart disease Maternal Grandfather        CAD- living, ?hx of cabg   Hypertension Maternal Grandfather    Hypertension Paternal Grandmother    Stroke Paternal Grandmother    COPD Neg Hx      HOME MEDICATIONS: Allergies as of 11/23/2024       Reactions   Cephalosporins Hives   Penicillins Hives        Medication List        Accurate as of November 23, 2024  8:36 AM. If you have any questions, ask your nurse or doctor.          clindamycin  300 MG capsule Commonly known as: Cleocin  Take 1 capsule (300 mg total) by mouth 3 (three) times daily.   levothyroxine  50 MCG tablet Commonly known as: SYNTHROID  Take 1 tablet (50 mcg total) by mouth daily.   Tri-Lo-Marzia 0.18/0.215/0.25 MG-25 MCG Tabs Generic drug: Norgestimate-Eth Estradiol TAKE 1 TABLET DAILY          OBJECTIVE:   PHYSICAL EXAM: VS: BP 116/72   Pulse 77   Ht 5' 5 (1.651  m)   Wt 127 lb (57.6 kg)   SpO2 99%   BMI 21.13 kg/m     EXAM: General: Pt appears well and is in NAD  Neck: General: Supple without adenopathy. Thyroid : Thyroid  size normal.  No goiter or nodules appreciated.   Lungs: Clear with good BS bilat   Heart: Auscultation: RRR.  Abdomen: Gravid uterus  Extremities:  BL LE: No pretibial edema normal ROM and strength.  Mental Status: Judgment, insight: Intact Mood and affect: No depression, anxiety, or agitation     DATA REVIEWED:   Latest Reference Range & Units 11/24/23 09:00  TSH mIU/L 1.69  T4,Free(Direct) 0.8 - 1.8 ng/dL 1.4   ASSESSMENT / PLAN / RECOMMENDATIONS:   Hypothyroidism:   -Patient is clinically euthyroid - No local neck symptoms - No desire to conceive in the  near future - TFTs****   Medications    Continue levothyroxine  50 mcg daily     F/U in 1 yr    Signed electronically by: Stefano Redgie Butts, MD  [facility]  [facility] [address] [place] [place], [place] [address] Phone: [phone] FAX: [phone]      CC: Daryl Setter, NP [address], [address] [place] [address] Phone: [phone]  Fax: [phone]   Return to Endocrinology clinic as below: Future Appointments  Date Time Provider Department Center  03/08/2025  8:20 AM Daryl Setter, NP [facility] [address]

## 2024-11-23 NOTE — Patient Instructions (Signed)

## 2025-03-08 ENCOUNTER — Encounter: Admitting: Family

## 2025-11-25 ENCOUNTER — Ambulatory Visit: Admitting: Internal Medicine
